# Patient Record
Sex: Male | Born: 1960
Health system: Southern US, Community
[De-identification: ages and names within clinical notes are randomized; demographics above are authoritative.]

## PROBLEM LIST (undated history)

## (undated) DIAGNOSIS — R1032 Left lower quadrant pain: Secondary | ICD-10-CM

## (undated) DIAGNOSIS — R55 Syncope and collapse: Secondary | ICD-10-CM

## (undated) DIAGNOSIS — G40909 Epilepsy, unspecified, not intractable, without status epilepticus: Secondary | ICD-10-CM

## (undated) DIAGNOSIS — R569 Unspecified convulsions: Secondary | ICD-10-CM

## (undated) HISTORY — DX: Unspecified convulsions: R56.9

## (undated) HISTORY — DX: Syncope and collapse: R55

## (undated) HISTORY — PX: HERNIA REPAIR: SHX51

## (undated) HISTORY — DX: Epilepsy, unspecified, not intractable, without status epilepticus: G40.909

## (undated) HISTORY — PX: TONSILLECTOMY: SUR1361

## (undated) HISTORY — DX: Left lower quadrant pain: R10.32

---

## 1998-12-04 HISTORY — PX: LASIK: SHX215

## 2001-12-04 HISTORY — PX: BLEPHAROPLASTY: SUR158

## 2006-11-23 ENCOUNTER — Encounter: Payer: Self-pay | Admitting: Family Medicine

## 2009-04-19 ENCOUNTER — Ambulatory Visit: Payer: Self-pay | Admitting: Family Medicine

## 2009-04-19 DIAGNOSIS — R1032 Left lower quadrant pain: Secondary | ICD-10-CM | POA: Insufficient documentation

## 2009-04-19 HISTORY — DX: Left lower quadrant pain: R10.32

## 2009-04-19 LAB — CONVERTED CEMR LAB
Bilirubin Urine: NEGATIVE
Blood in Urine, dipstick: NEGATIVE

## 2009-04-20 LAB — CONVERTED CEMR LAB
Basophils Absolute: 0 10*3/uL (ref 0.0–0.1)
Basophils Relative: 0 % (ref 0.0–3.0)
Eosinophils Absolute: 0 10*3/uL (ref 0.0–0.7)
HCT: 41.8 % (ref 39.0–52.0)
Hemoglobin: 14.5 g/dL (ref 13.0–17.0)
Lymphocytes Relative: 12.5 % (ref 12.0–46.0)
Lymphs Abs: 1.6 10*3/uL (ref 0.7–4.0)
MCHC: 34.8 g/dL (ref 30.0–36.0)
Monocytes Relative: 2.6 % — ABNORMAL LOW (ref 3.0–12.0)
Neutro Abs: 10.9 10*3/uL — ABNORMAL HIGH (ref 1.4–7.7)
RBC: 4.32 M/uL (ref 4.22–5.81)
RDW: 11.9 % (ref 11.5–14.6)

## 2009-04-26 ENCOUNTER — Encounter: Payer: Self-pay | Admitting: Family Medicine

## 2009-04-26 DIAGNOSIS — F449 Dissociative and conversion disorder, unspecified: Secondary | ICD-10-CM | POA: Insufficient documentation

## 2010-09-21 ENCOUNTER — Telehealth: Payer: Self-pay | Admitting: Family Medicine

## 2010-09-28 ENCOUNTER — Ambulatory Visit: Payer: Self-pay | Admitting: Family Medicine

## 2010-09-28 LAB — CONVERTED CEMR LAB
AST: 22 units/L (ref 0–37)
Alkaline Phosphatase: 68 units/L (ref 39–117)
Basophils Relative: 0.5 % (ref 0.0–3.0)
Bilirubin, Direct: 0.1 mg/dL (ref 0.0–0.3)
Blood in Urine, dipstick: NEGATIVE
Calcium: 8.8 mg/dL (ref 8.4–10.5)
Creatinine, Ser: 0.9 mg/dL (ref 0.4–1.5)
Eosinophils Absolute: 0.1 10*3/uL (ref 0.0–0.7)
Eosinophils Relative: 1.8 % (ref 0.0–5.0)
GFR calc non Af Amer: 91.16 mL/min (ref >60)
HDL: 38 mg/dL — ABNORMAL LOW (ref >39.00)
Hemoglobin: 14.6 g/dL (ref 13.0–17.0)
Ketones, urine, test strip: NEGATIVE
LDL Cholesterol: 115 mg/dL — ABNORMAL HIGH (ref 0–99)
Lymphocytes Relative: 35.5 % (ref 12.0–46.0)
MCHC: 35.4 g/dL (ref 30.0–36.0)
Monocytes Relative: 12.2 % — ABNORMAL HIGH (ref 3.0–12.0)
Neutro Abs: 2.2 10*3/uL (ref 1.4–7.7)
Neutrophils Relative %: 50 % (ref 43.0–77.0)
Nitrite: NEGATIVE
RBC: 4.26 M/uL (ref 4.22–5.81)
Sodium: 135 meq/L (ref 135–145)
Specific Gravity, Urine: 1.01
Total CHOL/HDL Ratio: 4
Total Protein: 6.1 g/dL (ref 6.0–8.3)
Triglycerides: 57 mg/dL (ref 0.0–149.0)
Urobilinogen, UA: 0.2
VLDL: 11.4 mg/dL (ref 0.0–40.0)
WBC Urine, dipstick: NEGATIVE
WBC: 4.3 10*3/uL — ABNORMAL LOW (ref 4.5–10.5)

## 2010-10-10 ENCOUNTER — Ambulatory Visit: Payer: Self-pay | Admitting: Family Medicine

## 2011-01-05 NOTE — Progress Notes (Signed)
Summary: REQUEST FOR ORDER  Phone Note Call from Patient   Caller: Patient's wife Neysa Bonito)  [phone] Summary of Call: Pt set up appt for cpx / labs on 10/26.... would like to have order for PSA to be added to cpx labs...Marland KitchenMarland KitchenCan you advise?   Initial call taken by: Debbra Riding,  September 21, 2010 10:52 AM  Follow-up for Phone Call        we can go ahead and order.  Only issue is whether insurance will cover priior to 50 if no FH. Follow-up by: Evelena Peat MD,  September 21, 2010 12:28 PM  Additional Follow-up for Phone Call Additional follow up Details #1::        I called and spoke with pts wife... she adv to go ahead and add lab order for PSA to the cpx labwork that pt is having done on 10/26.... Pts wife understands cost of test if ins doesn't pay (approx $58).  Additional Follow-up by: Debbra Riding,  September 21, 2010 12:50 PM

## 2011-01-05 NOTE — Assessment & Plan Note (Signed)
Summary: CPX // RS   Vital Signs:  Patient profile:   50 year old male Height:      70.50 inches Weight:      185 pounds BMI:     26.26 Temp:     98.5 degrees F oral Pulse rate:   72 / minute Pulse rhythm:   regular Resp:     12 per minute BP sitting:   118 / 80  (left arm) Cuff size:   regular  Vitals Entered By: Sid Falcon LPN (October 10, 2010 9:11 AM)  Nutrition Counseling: Patient's BMI is greater than 25 and therefore counseled on weight management options.  History of Present Illness: Here for CPE.  History of a seizure disorder which has been stable on Keppra. He continues to see neurologist once yearly.  No recent seizures.   Last tetanus 2006. Already received flu vaccine. Exercises regularly about 5-6 days per week.  Past medical history, social history, and family history reviewed  Clinical Review Panels:  Immunizations   Last Tetanus Booster:  Td (06/09/2005)  Lipid Management   Cholesterol:  164 (09/28/2010)   LDL (bad choesterol):  115 (09/28/2010)   HDL (good cholesterol):  38.00 (09/28/2010)  Diabetes Management   Creatinine:  0.9 (09/28/2010)  CBC   WBC:  4.3 (09/28/2010)   RBC:  4.26 (09/28/2010)   Hgb:  14.6 (09/28/2010)   Hct:  41.1 (09/28/2010)   Platelets:  231.0 (09/28/2010)   MCV  96.5 (09/28/2010)   MCHC  35.4 (09/28/2010)   RDW  12.3 (09/28/2010)   PMN:  50.0 (09/28/2010)   Lymphs:  35.5 (09/28/2010)   Monos:  12.2 (09/28/2010)   Eosinophils:  1.8 (09/28/2010)   Basophil:  0.5 (09/28/2010)  Complete Metabolic Panel   Glucose:  87 (09/28/2010)   Sodium:  135 (09/28/2010)   Potassium:  4.2 (09/28/2010)   Chloride:  100 (09/28/2010)   CO2:  30 (09/28/2010)   BUN:  9 (09/28/2010)   Creatinine:  0.9 (09/28/2010)   Albumin:  3.7 (09/28/2010)   Total Protein:  6.1 (09/28/2010)   Calcium:  8.8 (09/28/2010)   Total Bili:  0.7 (09/28/2010)   Alk Phos:  68 (09/28/2010)   SGPT (ALT):  17 (09/28/2010)   SGOT (AST):  22  (09/28/2010)   Allergies (verified): No Known Drug Allergies  Past History:  Past Medical History: Last updated: 04/26/2009 Chicken pox autonomic seizures with deja-vu symptoms  Past Surgical History: Last updated: 04/26/2009 Hernia 1987 Hernia 1994 Tonsillectomy  Family History: Last updated: 10/10/2010 Family History of Alcoholism/Addiction, parents Family History Hypertension  father Family History of Stroke M 1st degree relative father 20s Family History Lung cancer  grandfather Father Melanoma  Social History: Last updated: 04/26/2009 Occupation:  Sales Married Never Smoked Alcohol use-yes Regular exercise-yes  Risk Factors: Exercise: yes (04/26/2009)  Risk Factors: Smoking Status: never (04/19/2009) PMH-FH-SH reviewed for relevance  Family History: Family History of Alcoholism/Addiction, parents Family History Hypertension  father Family History of Stroke M 1st degree relative father 9s Family History Lung cancer  grandfather Father Melanoma  Review of Systems  The patient denies anorexia, fever, weight loss, weight gain, vision loss, decreased hearing, hoarseness, chest pain, syncope, dyspnea on exertion, peripheral edema, prolonged cough, headaches, hemoptysis, abdominal pain, melena, hematochezia, severe indigestion/heartburn, hematuria, incontinence, genital sores, muscle weakness, suspicious skin lesions, transient blindness, difficulty walking, depression, unusual weight change, abnormal bleeding, enlarged lymph nodes, and testicular masses.    Physical Exam  General:  Well-developed,well-nourished,in no acute distress;  alert,appropriate and cooperative throughout examination Head:  Normocephalic and atraumatic without obvious abnormalities.  Eyes:  No corneal or conjunctival inflammation noted. EOMI. Perrla. Funduscopic exam benign, without hemorrhages, exudates or papilledema. Vision grossly normal. Ears:  External ear exam shows no significant  lesions or deformities.  Otoscopic examination reveals clear canals, tympanic membranes are intact bilaterally without bulging, retraction, inflammation or discharge. Hearing is grossly normal bilaterally. Mouth:  Oral mucosa and oropharynx without lesions or exudates.  Teeth in good repair. Neck:  No deformities, masses, or tenderness noted. Lungs:  Normal respiratory effort, chest expands symmetrically. Lungs are clear to auscultation, no crackles or wheezes. Heart:  Normal rate and regular rhythm. S1 and S2 normal without gallop, murmur, click, rub or other extra sounds. Abdomen:  Bowel sounds positive,abdomen soft and non-tender without masses, organomegaly or hernias noted. Rectal:  No external abnormalities noted. Normal sphincter tone. No rectal masses or tenderness. Prostate:  Prostate gland firm and smooth, no enlargement, nodularity, tenderness, mass, asymmetry or induration. Msk:  No deformity or scoliosis noted of thoracic or lumbar spine.   Extremities:  No clubbing, cyanosis, edema, or deformity noted with normal full range of motion of all joints.   Neurologic:  alert & oriented X3 and cranial nerves II-XII intact.   Skin:  small angioma right cheek no concerning skin lesions noted Cervical Nodes:  No lymphadenopathy noted Psych:  Cognition and judgment appear intact. Alert and cooperative with normal attention span and concentration. No apparent delusions, illusions, hallucinations   Impression & Recommendations:  Problem # 1:  Preventive Health Care (ICD-V70.0) labs reviewed with patient and all favorable with the exception of lower HDL. Continue regular exercise. Tetanus up-to-date. Copy of labs to neurologist.  Complete Medication List: 1)  Keppra 250 Mg Tabs (Levetiracetam) .... Take 1 tablet by mouth at bedtime   Orders Added: 1)  Est. Patient 40-64 years [[id number]]

## 2011-05-31 ENCOUNTER — Telehealth: Payer: Self-pay | Admitting: Family Medicine

## 2011-05-31 MED ORDER — SCOPOLAMINE 1 MG/3DAYS TD PT72: 1.0000 | MEDICATED_PATCH | TRANSDERMAL | Status: AC

## 2011-05-31 NOTE — Telephone Encounter (Signed)
Pt called and is req to get a motion sickness patch, Transderm Scope. Pt is going on a fishing trip next week. Pls call in to [facility].

## 2011-05-31 NOTE — Telephone Encounter (Signed)
Please advise 

## 2011-05-31 NOTE — Telephone Encounter (Signed)
Ok to prescribe scopolamine patch one behind ear every 3 days prn motion sickness.

## 2011-05-31 NOTE — Telephone Encounter (Signed)
Pt informed Rx sent to his pharmacy on [address]

## 2013-01-29 ENCOUNTER — Telehealth: Payer: Self-pay | Admitting: Family Medicine

## 2013-01-29 NOTE — Telephone Encounter (Signed)
yes

## 2013-01-29 NOTE — Telephone Encounter (Signed)
Pt is having CPX labs done tomorrow (2/27). Pt would also like to have test for testosterone level done. Is that OK? Can you put in computer?

## 2013-01-30 ENCOUNTER — Other Ambulatory Visit (INDEPENDENT_AMBULATORY_CARE_PROVIDER_SITE_OTHER): Payer: BC Managed Care – PPO

## 2013-01-30 DIAGNOSIS — Z0000 Encounter for general adult medical examination without abnormal findings: Secondary | ICD-10-CM

## 2013-01-30 LAB — CBC WITH DIFFERENTIAL/PLATELET
Basophils Absolute: 0 10*3/uL (ref 0.0–0.1)
HCT: 41.4 % (ref 39.0–52.0)
Lymphs Abs: 1.8 10*3/uL (ref 0.7–4.0)
MCV: 93.8 fl (ref 78.0–100.0)
Monocytes Absolute: 0.4 10*3/uL (ref 0.1–1.0)
Monocytes Relative: 7.2 % (ref 3.0–12.0)
Neutrophils Relative %: 55.3 % (ref 43.0–77.0)
Platelets: 311 10*3/uL (ref 150.0–400.0)
RDW: 12.8 % (ref 11.5–14.6)

## 2013-01-30 LAB — POCT URINALYSIS DIPSTICK
Blood, UA: NEGATIVE
Ketones, UA: NEGATIVE
Leukocytes, UA: NEGATIVE
Protein, UA: NEGATIVE
pH, UA: 7

## 2013-01-30 LAB — BASIC METABOLIC PANEL
BUN: 13 mg/dL (ref 6–23)
Creatinine, Ser: 1.1 mg/dL (ref 0.4–1.5)
GFR: 79.45 mL/min (ref >60.00)
Glucose, Bld: 88 mg/dL (ref 70–99)
Potassium: 4.3 mEq/L (ref 3.5–5.1)

## 2013-01-30 LAB — LIPID PANEL
HDL: 47.5 mg/dL (ref >39.00)
LDL Cholesterol: 106 mg/dL — ABNORMAL HIGH (ref 0–99)
Total CHOL/HDL Ratio: 4
VLDL: 15.4 mg/dL (ref 0.0–40.0)

## 2013-01-30 LAB — HEPATIC FUNCTION PANEL
Bilirubin, Direct: 0.1 mg/dL (ref 0.0–0.3)
Total Bilirubin: 0.7 mg/dL (ref 0.3–1.2)

## 2013-01-30 LAB — TSH: TSH: 2.64 u[IU]/mL (ref 0.35–5.50)

## 2013-01-30 LAB — PSA: PSA: 0.56 ng/mL (ref 0.10–4.00)

## 2013-02-12 ENCOUNTER — Encounter: Payer: Self-pay | Admitting: Family Medicine

## 2013-02-12 ENCOUNTER — Ambulatory Visit (INDEPENDENT_AMBULATORY_CARE_PROVIDER_SITE_OTHER): Payer: BC Managed Care – PPO | Admitting: Family Medicine

## 2013-02-12 VITALS — BP 130/80 | HR 72 | Temp 97.7°F | Resp 12 | Ht 71.0 in | Wt 189.0 lb

## 2013-02-12 DIAGNOSIS — G40909 Epilepsy, unspecified, not intractable, without status epilepticus: Secondary | ICD-10-CM

## 2013-02-12 HISTORY — DX: Epilepsy, unspecified, not intractable, without status epilepticus: G40.909

## 2013-02-12 NOTE — Patient Instructions (Addendum)
Call me when you are ready to schedule colonoscopy. 
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
## 2013-07-09 ENCOUNTER — Telehealth: Payer: Self-pay | Admitting: Nurse Practitioner

## 2013-07-09 MED ORDER — LEVETIRACETAM 250 MG PO TABS: 250.0000 mg | ORAL_TABLET | Freq: Two times a day (BID) | ORAL | Status: DC

## 2013-07-09 NOTE — Telephone Encounter (Signed)
Rx sent 

## 2013-07-29 ENCOUNTER — Encounter: Payer: Self-pay | Admitting: Neurology

## 2013-07-30 ENCOUNTER — Ambulatory Visit (INDEPENDENT_AMBULATORY_CARE_PROVIDER_SITE_OTHER): Payer: BC Managed Care – PPO | Admitting: Neurology

## 2013-07-30 ENCOUNTER — Encounter: Payer: Self-pay | Admitting: Neurology

## 2013-07-30 VITALS — BP 128/80 | HR 68 | Resp 16 | Wt 186.0 lb

## 2013-07-30 DIAGNOSIS — R404 Transient alteration of awareness: Secondary | ICD-10-CM

## 2013-07-30 MED ORDER — LEVETIRACETAM 250 MG PO TABS: 250.0000 mg | ORAL_TABLET | Freq: Two times a day (BID) | ORAL | Status: DC

## 2013-07-30 NOTE — Patient Instructions (Addendum)
Levetiracetam tablets What is this medicine? LEVETIRACETAM (lee ve tye RA se tam) is an antiepileptic drug. It is used with other medicines to treat certain types of seizures. This medicine may be used for other purposes; ask your health care provider or pharmacist if you have questions. What should I tell my health care provider before I take this medicine? They need to know if you have any of these conditions: -kidney disease -suicidal thoughts, plans, or attempt; a previous suicide attempt by you or a family member -an unusual or allergic reaction to levetiracetam, other medicines, foods, dyes, or preservatives -pregnant or trying to get pregnant -breast-feeding How should I use this medicine? Take this medicine by mouth with a glass of water. Follow the directions on the prescription label. Do not cut, crush, or chew this medicine. You may take this medicine with or without food. Take your doses at regular intervals. Do not take your medicine more often than directed. Do not stop taking this medicine or any of your seizure medicines unless instructed by your doctor or health care professional. Stopping your medicine suddenly can increase your seizures or their severity. A special MedGuide will be given to you by the pharmacist with each prescription and refill. Be sure to read this information carefully each time. Contact your pediatrician or health care professional regarding the use of this medication in children. While this drug may be prescribed for children as young as 4 years of age for selected conditions, precautions do apply. Overdosage: If you think you have taken too much of this medicine contact a poison control center or emergency room at once. NOTE: This medicine is only for you. Do not share this medicine with others. What if I miss a dose? If you miss a dose, take it as soon as you can. If it is almost time for your next dose, take only that dose. Do not take double or extra  doses. What may interact with this medicine? -sevelamer This list may not describe all possible interactions. Give your health care provider a list of all the medicines, herbs, non-prescription drugs, or dietary supplements you use. Also tell them if you smoke, drink alcohol, or use illegal drugs. Some items may interact with your medicine. What should I watch for while using this medicine? Visit your doctor or health care professional for a regular check on your progress. Wear a medical identification bracelet or chain to say you have epilepsy, and carry a card that lists all your medications. It is important to take this medicine exactly as instructed by your health care professional. When first starting treatment, your dose may need to be adjusted. It may take weeks or months before your dose is stable. You should contact your doctor or health care professional if your seizures get worse or if you have any new types of seizures. You may get drowsy or dizzy. Do not drive, use machinery, or do anything that needs mental alertness until you know how this medicine affects you. Do not stand or sit up quickly, especially if you are an older patient. This reduces the risk of dizzy or fainting spells. Alcohol may interfere with the effect of this medicine. Avoid alcoholic drinks. The use of this medicine may increase the chance of suicidal thoughts or actions. Pay special attention to how you are responding while on this medicine. Any worsening of mood, or thoughts of suicide or dying should be reported to your health care professional right away. Women who become pregnant   while using this medicine may enroll in the [place] American Antiepileptic Drug Pregnancy Registry by calling 1-888-233-2334. This registry collects information about the safety of antiepileptic drug use during pregnancy. What side effects may I notice from receiving this medicine? Side effects you should report to your doctor or health care  professional as soon as possible: -allergic reactions like skin rash, itching or hives, swelling of the face, lips, or tongue -breathing problems -dark urine -general ill feeling or flu-like symptoms -problems with balance, talking, walking -unusually weak or tired -worsening of mood, thoughts or actions of suicide or dying -yellowing of the eyes or skin Side effects that usually do not require medical attention (report to your doctor or health care professional if they continue or are bothersome): -diarrhea -dizzy, drowsy -headache -loss of appetite This list may not describe all possible side effects. Call your doctor for medical advice about side effects. You may report side effects to FDA at 1-800-FDA-1088. Where should I keep my medicine? Keep out of reach of children. Store at room temperature between 15 and 30 degrees C (59 and 86 degrees F). Throw away any unused medicine after the expiration date. NOTE: This sheet is a summary. It may not cover all possible information. If you have questions about this medicine, talk to your doctor, pharmacist, or health care provider.  2013, Elsevier/Gold Standard. (10/04/2011 12:23:20 PM)  

## 2013-07-30 NOTE — Progress Notes (Signed)
[facility]  Provider:  Melvyn Novas, M D  Referring Provider: Kristian Covey, MD Primary Care Physician:  Kristian Covey, MD  No chief complaint on file.   HPI:  Robert Nielsen is a 52 y.o. male  Is seen here as a referral/ revisit  from Dr. Caryl Never for  spells of alteration of consciousness.   Patient was last seen in Gen. 2013. His work diagnosis was possible seizures. The patient had started on keppra 250 mg twice a day and has not had any spells since being continuously treated with this medication, although he has now to take the generic form. He had no neurologic complaints today  and denies any side effects to the medication.  Review of Systems: Out of a complete 14 system review, the patient complains of only the following symptoms, and all other reviewed systems are negative. None.  History   Social History  . Marital Status: Married    Spouse Name: Silva Bandy    Number of Children: 3  . Years of Education: Assoc.   Occupational History  . Materials engineer   Social History Main Topics  . Smoking status: Never Smoker   . Smokeless tobacco: Never Used  . Alcohol Use: Yes     Comment: 1-2 drinks weekly  . Drug Use: No  . Sexual Activity: Not on file   Other Topics Concern  . Not on file   Social History Narrative   Patient is ambidextrous, consumes four  Cups of caffeinated beverages daily.    Family History  Problem Relation Age of Onset  . Cancer Father 35    melanoma  . Hypertension Father     Past Medical History  Diagnosis Date  . Abdominal pain, left lower quadrant 04/19/2009    Qualifier: Diagnosis of  By: Caryl Never MD, Bruce    . Seizure disorder 02/12/2013  . Syncope and collapse   . Seizures     Past Surgical History  Procedure Laterality Date  . Hernia repair  1987, 1994  . Tonsillectomy      Current Outpatient Prescriptions  Medication Sig Dispense Refill  . levETIRAcetam (KEPPRA)  250 MG tablet Take 1 tablet (250 mg total) by mouth 2 (two) times daily.  180 tablet  0   No current facility-administered medications for this visit.    Allergies as of 07/30/2013  . (No Known Allergies)    Vitals: BP 128/80  Pulse 68  Resp 16  Wt 186 lb (84.369 kg)  BMI 25.58 kg/m2 Last Weight:  Wt Readings from Last 1 Encounters:  07/30/13 186 lb (84.369 kg)   Last Height:   Ht Readings from Last 1 Encounters:  07/29/13 5' 11.5" (1.816 m)    Physical exam:  General: The patient is awake, alert and appears not in acute distress. The patient is well groomed. Head: Normocephalic, atraumatic. Neck is supple. Cardiovascular:  Regular rate and rhythm, without  murmurs or carotid bruit, and without distended neck veins. Respiratory: Lungs are clear to auscultation. Skin:  Without evidence of edema, or rash Trunk:patient  has normal posture.  Neurologic exam : The patient is awake and alert, oriented to place and time.  Memory subjective  described as intact. There is a normal attention span & concentration ability. Speech is fluent without  dysarthria, dysphonia or aphasia. Mood and affect are appropriate.  Cranial nerves: Pupils are equal and briskly reactive to light. Funduscopic exam without evidence of pallor  or edema. Extraocular movements  in vertical and horizontal planes intact and without nystagmus. Visual fields by finger perimetry are intact. Hearing to finger rub intact.  Facial sensation intact to fine touch. Facial motor strength is symmetric and tongue and uvula move midline.  Motor exam:   Normal tone and normal muscle bulk and symmetric normal strength in all extremities.  Sensory:  Fine touch, pinprick and vibration were tested in all extremities. Proprioception is tested  normal.  Coordination: Rapid alternating movements in the fingers/hands is tested and normal. Finger-to-nose maneuver tested and normal without evidence of ataxia, dysmetria or  tremor.  Gait and station: Patient walks without assistive device and is able and assisted stool climb up to the exam table. Strength within normal limits. Stance is stable and normal. Tandem gait isunfragmented.  Deep tendon reflexes: in the  upper and lower extremities are symmetric and intact. Babinski maneuver response is downgoing.   Assessment:  After physical and neurologic examination, review of laboratory studies, imaging, neurophysiology testing and pre-existing records, assessment is that of idiopathic, cryptogenic seizures. The patient had no spells since being on Or. We'll refill his medication today.  His primary care physician, Dr. Caryl Never, has regularly followed with lap pads to that I do not need to repeat these today. I will also not order further memory testing in the future.  Plan: refill , every 12 month RV with NP.

## 2015-01-11 ENCOUNTER — Other Ambulatory Visit (INDEPENDENT_AMBULATORY_CARE_PROVIDER_SITE_OTHER): Payer: 59

## 2015-01-11 DIAGNOSIS — Z0000 Encounter for general adult medical examination without abnormal findings: Secondary | ICD-10-CM

## 2015-01-11 LAB — BASIC METABOLIC PANEL
BUN: 13 mg/dL (ref 6–23)
CHLORIDE: 102 meq/L (ref 96–112)
CO2: 28 meq/L (ref 19–32)
Calcium: 9 mg/dL (ref 8.4–10.5)
Creatinine, Ser: 0.94 mg/dL (ref 0.40–1.50)
GFR: 89.58 mL/min (ref >60.00)
GLUCOSE: 86 mg/dL (ref 70–99)
POTASSIUM: 4.2 meq/L (ref 3.5–5.1)
Sodium: 136 mEq/L (ref 135–145)

## 2015-01-11 LAB — CBC WITH DIFFERENTIAL/PLATELET
BASOS ABS: 0 10*3/uL (ref 0.0–0.1)
BASOS PCT: 0.4 % (ref 0.0–3.0)
EOS ABS: 0.1 10*3/uL (ref 0.0–0.7)
Eosinophils Relative: 1.1 % (ref 0.0–5.0)
HEMATOCRIT: 41.7 % (ref 39.0–52.0)
HEMOGLOBIN: 14.3 g/dL (ref 13.0–17.0)
LYMPHS ABS: 1.7 10*3/uL (ref 0.7–4.0)
LYMPHS PCT: 23.6 % (ref 12.0–46.0)
MCHC: 34.3 g/dL (ref 30.0–36.0)
MCV: 95.9 fl (ref 78.0–100.0)
MONO ABS: 0.5 10*3/uL (ref 0.1–1.0)
Monocytes Relative: 6.6 % (ref 3.0–12.0)
NEUTROS ABS: 4.8 10*3/uL (ref 1.4–7.7)
Neutrophils Relative %: 68.3 % (ref 43.0–77.0)
Platelets: 262 10*3/uL (ref 150.0–400.0)
RBC: 4.35 Mil/uL (ref 4.22–5.81)
RDW: 12.8 % (ref 11.5–15.5)
WBC: 7 10*3/uL (ref 4.0–10.5)

## 2015-01-11 LAB — HEPATIC FUNCTION PANEL
ALBUMIN: 3.9 g/dL (ref 3.5–5.2)
ALT: 9 U/L (ref 0–53)
AST: 15 U/L (ref 0–37)
Alkaline Phosphatase: 65 U/L (ref 39–117)
BILIRUBIN DIRECT: 0.1 mg/dL (ref 0.0–0.3)
BILIRUBIN TOTAL: 0.5 mg/dL (ref 0.2–1.2)
TOTAL PROTEIN: 6 g/dL (ref 6.0–8.3)

## 2015-01-11 LAB — LIPID PANEL
CHOLESTEROL: 143 mg/dL (ref 0–200)
HDL: 48.6 mg/dL (ref >39.00)
LDL CALC: 81 mg/dL (ref 0–99)
NonHDL: 94.4
Total CHOL/HDL Ratio: 3
Triglycerides: 65 mg/dL (ref 0.0–149.0)
VLDL: 13 mg/dL (ref 0.0–40.0)

## 2015-01-11 LAB — PSA: PSA: 0.48 ng/mL (ref 0.10–4.00)

## 2015-01-11 LAB — TSH: TSH: 3.19 u[IU]/mL (ref 0.35–4.50)

## 2015-01-28 ENCOUNTER — Ambulatory Visit (INDEPENDENT_AMBULATORY_CARE_PROVIDER_SITE_OTHER): Payer: 59 | Admitting: Family Medicine

## 2015-01-28 ENCOUNTER — Encounter: Payer: Self-pay | Admitting: Family Medicine

## 2015-01-28 VITALS — BP 130/80 | HR 79 | Temp 98.0°F | Ht 71.26 in | Wt 172.0 lb

## 2015-01-28 DIAGNOSIS — M898X9 Other specified disorders of bone, unspecified site: Secondary | ICD-10-CM

## 2015-01-28 DIAGNOSIS — Z0000 Encounter for general adult medical examination without abnormal findings: Secondary | ICD-10-CM

## 2015-01-28 DIAGNOSIS — M899 Disorder of bone, unspecified: Secondary | ICD-10-CM

## 2015-01-28 DIAGNOSIS — Z23 Encounter for immunization: Secondary | ICD-10-CM

## 2015-01-28 DIAGNOSIS — R404 Transient alteration of awareness: Secondary | ICD-10-CM

## 2015-01-28 MED ORDER — LEVETIRACETAM 250 MG PO TABS: 250.0000 mg | ORAL_TABLET | Freq: Two times a day (BID) | ORAL | Status: DC

## 2015-01-28 NOTE — Progress Notes (Signed)
Subjective:    Patient ID: Robert Nielsen, male    DOB: [birth year], 54 y.o.   MRN: [id number]  HPI Patient seen for medical follow-up. 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 He's been followed by neurology and recently released by them. Has been maintained for several years on low-dose Keppra which seems to work. Otherwise very healthy. Exercises about 5 days a week. His tetanus is due this year. He has not had colonoscopy and is not ready to go this point yet.  He has had some prominence of the past 6 right months of his right distal clavicle but never associated pain. No history of injury. He has had full range of motion right shoulder.  Past Medical History  Diagnosis Date  . Abdominal pain, left lower quadrant 04/19/2009    Qualifier: Diagnosis of  By: Elease Hashimoto MD, Bruce    . Seizure disorder 02/12/2013  . Syncope and collapse   . Seizures    Past Surgical History  Procedure Laterality Date  . Hernia repair  1987, 1994  . Tonsillectomy      reports that he has never smoked. He has never used smokeless tobacco. He reports that he drinks alcohol. He reports that he does not use illicit drugs. family history includes Cancer (age of onset: 29) in his father; Hypertension in his father. No Known Allergies    Review of Systems  Constitutional: Negative for fever, activity change, appetite change and fatigue.  HENT: Negative for congestion, ear pain and trouble swallowing.   Eyes: Negative for pain and visual disturbance.  Respiratory: Negative for cough, shortness of breath and wheezing.   Cardiovascular: Negative for chest pain and palpitations.  Gastrointestinal: Negative for nausea, vomiting, abdominal pain, diarrhea, constipation, blood in stool, abdominal distention and rectal pain.  Genitourinary: Negative for dysuria, hematuria and testicular pain.  Musculoskeletal: Negative for joint swelling and arthralgias.  Skin: Negative  for rash.  Neurological: Negative for dizziness, syncope and headaches.  Hematological: Negative for adenopathy.  Psychiatric/Behavioral: Negative for confusion and dysphoric mood.       Objective:   Physical Exam  Constitutional: He is oriented to person, place, and time. He appears well-developed and well-nourished. No distress.  HENT:  Head: Normocephalic and atraumatic.  Right Ear: External ear normal.  Left Ear: External ear normal.  Mouth/Throat: Oropharynx is clear and moist.  Eyes: Conjunctivae and EOM are normal. Pupils are equal, round, and reactive to light.  Neck: Normal range of motion. Neck supple. No thyromegaly present.  Cardiovascular: Normal rate, regular rhythm and normal heart sounds.   No murmur heard. Pulmonary/Chest: No respiratory distress. He has no wheezes. He has no rales.  Abdominal: Soft. Bowel sounds are normal. He exhibits no distension and no mass. There is no tenderness. There is no rebound and no guarding.  Genitourinary: Rectum normal and prostate normal.  Musculoskeletal: He exhibits no edema.  Full range of motion right shoulder. Right distal clavicle reveals bony prominence which is nontender. This is visibly more prominent than the left side. No AC joint tenderness.  Lymphadenopathy:    He has no cervical adenopathy.  Neurological: He is alert and oriented to person, place, and time. He displays normal reflexes. No cranial nerve deficit.  Skin: No rash noted.  Psychiatric: He has a normal mood and affect.          Assessment & Plan:  Complete physical. Labs reviewed. We've recommended colonoscopy but this point he is  not willing to go yet. He'll let us know if he changes his mind. Tetanus booster given. Obtain plain films right clavicle to assess bony prominence. He does not have any worrisome symptoms such as pain

## 2015-01-28 NOTE — Progress Notes (Signed)
Pre visit review using our clinic review tool, if applicable. No additional management support is needed unless otherwise documented below in the visit note. 

## 2015-01-28 NOTE — Addendum Note (Signed)
Addended by: Marcina Millard on: 01/28/2015 10:16 AM   Modules accepted: Orders

## 2015-02-17 ENCOUNTER — Ambulatory Visit (INDEPENDENT_AMBULATORY_CARE_PROVIDER_SITE_OTHER)
Admission: RE | Admit: 2015-02-17 | Discharge: 2015-02-17 | Disposition: A | Discharge status: Home / Self Care | Payer: 59 | Source: Ambulatory Visit | Attending: Family Medicine | Admitting: Family Medicine

## 2015-02-17 DIAGNOSIS — M899 Disorder of bone, unspecified: Secondary | ICD-10-CM

## 2015-02-17 DIAGNOSIS — M898X9 Other specified disorders of bone, unspecified site: Secondary | ICD-10-CM

## 2016-03-09 ENCOUNTER — Other Ambulatory Visit (INDEPENDENT_AMBULATORY_CARE_PROVIDER_SITE_OTHER): Payer: 59

## 2016-03-09 DIAGNOSIS — Z0000 Encounter for general adult medical examination without abnormal findings: Secondary | ICD-10-CM | POA: Diagnosis not present

## 2016-03-09 LAB — BASIC METABOLIC PANEL
BUN: 15 mg/dL (ref 6–23)
CHLORIDE: 103 meq/L (ref 96–112)
CO2: 30 meq/L (ref 19–32)
CREATININE: 1.06 mg/dL (ref 0.40–1.50)
Calcium: 9.6 mg/dL (ref 8.4–10.5)
GFR: 77.63 mL/min (ref >60.00)
GLUCOSE: 88 mg/dL (ref 70–99)
Potassium: 4.5 mEq/L (ref 3.5–5.1)
Sodium: 139 mEq/L (ref 135–145)

## 2016-03-09 LAB — CBC WITH DIFFERENTIAL/PLATELET
BASOS PCT: 0.5 % (ref 0.0–3.0)
Basophils Absolute: 0 10*3/uL (ref 0.0–0.1)
EOS ABS: 0 10*3/uL (ref 0.0–0.7)
Eosinophils Relative: 0.8 % (ref 0.0–5.0)
HEMATOCRIT: 41.3 % (ref 39.0–52.0)
Hemoglobin: 14.4 g/dL (ref 13.0–17.0)
LYMPHS PCT: 42.6 % (ref 12.0–46.0)
Lymphs Abs: 1.9 10*3/uL (ref 0.7–4.0)
MCHC: 34.8 g/dL (ref 30.0–36.0)
MCV: 95.2 fl (ref 78.0–100.0)
MONOS PCT: 8.6 % (ref 3.0–12.0)
Monocytes Absolute: 0.4 10*3/uL (ref 0.1–1.0)
NEUTROS ABS: 2.1 10*3/uL (ref 1.4–7.7)
Neutrophils Relative %: 47.5 % (ref 43.0–77.0)
PLATELETS: 269 10*3/uL (ref 150.0–400.0)
RBC: 4.34 Mil/uL (ref 4.22–5.81)
RDW: 13.1 % (ref 11.5–15.5)
WBC: 4.4 10*3/uL (ref 4.0–10.5)

## 2016-03-09 LAB — PSA: PSA: 0.56 ng/mL (ref 0.10–4.00)

## 2016-03-09 LAB — HEPATIC FUNCTION PANEL
ALBUMIN: 4.2 g/dL (ref 3.5–5.2)
ALT: 13 U/L (ref 0–53)
AST: 17 U/L (ref 0–37)
Alkaline Phosphatase: 53 U/L (ref 39–117)
Bilirubin, Direct: 0.1 mg/dL (ref 0.0–0.3)
TOTAL PROTEIN: 6.4 g/dL (ref 6.0–8.3)
Total Bilirubin: 0.9 mg/dL (ref 0.2–1.2)

## 2016-03-09 LAB — LIPID PANEL
CHOLESTEROL: 173 mg/dL (ref 0–200)
HDL: 56.3 mg/dL (ref >39.00)
LDL Cholesterol: 100 mg/dL — ABNORMAL HIGH (ref 0–99)
NonHDL: 116.64
Total CHOL/HDL Ratio: 3
Triglycerides: 81 mg/dL (ref 0.0–149.0)
VLDL: 16.2 mg/dL (ref 0.0–40.0)

## 2016-03-09 LAB — TSH: TSH: 2.75 u[IU]/mL (ref 0.35–4.50)

## 2016-03-15 ENCOUNTER — Encounter: Payer: Self-pay | Admitting: Family Medicine

## 2016-03-15 ENCOUNTER — Ambulatory Visit (INDEPENDENT_AMBULATORY_CARE_PROVIDER_SITE_OTHER): Payer: 59 | Admitting: Family Medicine

## 2016-03-15 VITALS — BP 90/70 | HR 90 | Temp 98.8°F | Ht 70.25 in | Wt 173.3 lb

## 2016-03-15 DIAGNOSIS — Z0000 Encounter for general adult medical examination without abnormal findings: Secondary | ICD-10-CM

## 2016-03-15 NOTE — Progress Notes (Signed)
Pre visit review using our clinic review tool, if applicable. No additional management support is needed unless otherwise documented below in the visit note. 

## 2016-03-15 NOTE — Progress Notes (Signed)
   Subjective:    Patient ID: Robert Nielsen, male    DOB: Apr 23, 1961, 55 y.o.   MRN: [id number]  HPI  Patient seen for physical exam Generally very healthy. Exercises most days of the week. Nonsmoker. He has history of transient alteration of awareness. Presumed seizure variant. For several years followed by neurology and treated with Keppra. Currently stable with no recent events.  Needs screening colonoscopy. Tetanus up-to-date. Declines shingles vaccine.  Past Medical History  Diagnosis Date  . Abdominal pain, left lower quadrant 04/19/2009    Qualifier: Diagnosis of  By: Elease Hashimoto MD, Saylah Ketner    . Seizure disorder ([place]) 02/12/2013  . Syncope and collapse   . Seizures [facility])    Past Surgical History  Procedure Laterality Date  . Hernia repair  1987, 1994  . Tonsillectomy      reports that he has never smoked. He has never used smokeless tobacco. He reports that he drinks alcohol. He reports that he does not use illicit drugs. family history includes Cancer (age of onset: 48) in his father; Hypertension in his father. No Known Allergies   Review of Systems  Constitutional: Negative for fever, activity change, appetite change and fatigue.  HENT: Negative for congestion, ear pain and trouble swallowing.   Eyes: Negative for pain and visual disturbance.  Respiratory: Negative for cough, chest tightness, shortness of breath and wheezing.   Cardiovascular: Negative for chest pain, palpitations and leg swelling.  Gastrointestinal: Negative for nausea, vomiting, abdominal pain, diarrhea, constipation, blood in stool, abdominal distention and rectal pain.  Endocrine: Negative for polydipsia and polyuria.  Genitourinary: Negative for dysuria, hematuria and testicular pain.  Musculoskeletal: Negative for joint swelling and arthralgias.  Skin: Negative for rash.  Neurological: Negative for dizziness, syncope, weakness, light-headedness and headaches.  Hematological: Negative for  adenopathy.  Psychiatric/Behavioral: Negative for confusion and dysphoric mood.       Objective:   Physical Exam  Constitutional: He is oriented to person, place, and time. He appears well-developed and well-nourished. No distress.  HENT:  Head: Normocephalic and atraumatic.  Right Ear: External ear normal.  Left Ear: External ear normal.  Mouth/Throat: Oropharynx is clear and moist.  Eyes: Conjunctivae and EOM are normal. Pupils are equal, round, and reactive to light.  Neck: Normal range of motion. Neck supple. No thyromegaly present.  Cardiovascular: Normal rate, regular rhythm and normal heart sounds.   No murmur heard. Pulmonary/Chest: No respiratory distress. He has no wheezes. He has no rales.  Abdominal: Soft. Bowel sounds are normal. He exhibits no distension and no mass. There is no tenderness. There is no rebound and no guarding.  Musculoskeletal: He exhibits no edema.  Lymphadenopathy:    He has no cervical adenopathy.  Neurological: He is alert and oriented to person, place, and time. He displays normal reflexes. No cranial nerve deficit.  Skin: No rash noted.  Psychiatric: He has a normal mood and affect.          Assessment & Plan:  Physical exam. Schedule screening colonoscopy. Continue regular healthy exercise habits. He also has excellent diet. Labs reviewed. No major concerns.

## 2016-03-15 NOTE — Patient Instructions (Signed)
We will set up your colonoscopy.

## 2016-03-17 ENCOUNTER — Other Ambulatory Visit: Payer: Self-pay | Admitting: Family Medicine

## 2016-04-03 ENCOUNTER — Encounter: Payer: Self-pay | Admitting: Internal Medicine

## 2016-05-09 ENCOUNTER — Encounter: Payer: Self-pay | Admitting: Internal Medicine

## 2016-05-09 ENCOUNTER — Ambulatory Visit (AMBULATORY_SURGERY_CENTER): Payer: Self-pay

## 2016-05-09 VITALS — Ht 70.0 in | Wt 172.2 lb

## 2016-05-09 DIAGNOSIS — Z1211 Encounter for screening for malignant neoplasm of colon: Secondary | ICD-10-CM

## 2016-05-09 MED ORDER — NA SULFATE-K SULFATE-MG SULF 17.5-3.13-1.6 GM/177ML PO SOLN: ORAL | Status: DC

## 2016-05-09 NOTE — Progress Notes (Signed)
Per pt, no allergies to soy or egg products.Pt not taking any weight loss meds or using  O2 at home. 

## 2016-05-17 ENCOUNTER — Encounter: Payer: Self-pay | Admitting: Internal Medicine

## 2016-05-17 ENCOUNTER — Ambulatory Visit (AMBULATORY_SURGERY_CENTER): Payer: 59 | Admitting: Internal Medicine

## 2016-05-17 VITALS — BP 114/60 | HR 72 | Temp 96.8°F | Resp 16 | Ht 70.0 in | Wt 173.0 lb

## 2016-05-17 DIAGNOSIS — D120 Benign neoplasm of cecum: Secondary | ICD-10-CM

## 2016-05-17 DIAGNOSIS — Z1211 Encounter for screening for malignant neoplasm of colon: Secondary | ICD-10-CM

## 2016-05-17 MED ORDER — SODIUM CHLORIDE 0.9 % IV SOLN: 500.0000 mL | INTRAVENOUS | Status: DC

## 2016-05-17 NOTE — Progress Notes (Signed)
  [facility] Post-op Note  Patient: Robert Nielsen  Procedure(s) Performed: colonoscopy  Patient Location: [facility] - Recovery Area  Anesthesia Type: Deep Sedation/Propofol  Level of Consciousness: awake, oriented and patient cooperative  Airway and Oxygen Therapy: Patient Spontanous Breathing  Post-op Pain: none  Post-op Assessment:  Post-op Vital signs reviewed, Patient's Cardiovascular Status Stable, Respiratory Function Stable, Patent Airway, No signs of Nausea or vomiting and Pain level controlled  Post-op Vital Signs: Reviewed and stable  Complications: No apparent anesthesia complications  Maureen Duesing E 8:58 AM

## 2016-05-17 NOTE — Patient Instructions (Signed)
YOU HAD AN ENDOSCOPIC PROCEDURE TODAY AT THE [facility]:   Refer to the procedure report that was given to you for any specific questions about what was found during the examination.  If the procedure report does not answer your questions, please call your gastroenterologist to clarify.  If you requested that your care partner not be given the details of your procedure findings, then the procedure report has been included in a sealed envelope for you to review at your convenience later.  YOU SHOULD EXPECT: Some feelings of bloating in the abdomen. Passage of more gas than usual.  Walking can help get rid of the air that was put into your GI tract during the procedure and reduce the bloating. If you had a lower endoscopy (such as a colonoscopy or flexible sigmoidoscopy) you may notice spotting of blood in your stool or on the toilet paper. If you underwent a bowel prep for your procedure, you may not have a normal bowel movement for a few days.  Please Note:  You might notice some irritation and congestion in your nose or some drainage.  This is from the oxygen used during your procedure.  There is no need for concern and it should clear up in a day or so.  SYMPTOMS TO REPORT IMMEDIATELY:   Following lower endoscopy (colonoscopy or flexible sigmoidoscopy):  Excessive amounts of blood in the stool  Significant tenderness or worsening of abdominal pains  Swelling of the abdomen that is new, acute  Fever of 100F or higher   For urgent or emergent issues, a gastroenterologist can be reached at any hour by calling [phone].   DIET: Your first meal following the procedure should be a small meal and then it is ok to progress to your normal diet. Heavy or fried foods are harder to digest and may make you feel nauseous or bloated.  Likewise, meals heavy in dairy and vegetables can increase bloating.  Drink plenty of fluids but you should avoid alcoholic beverages for 24  hours.  ACTIVITY:  You should plan to take it easy for the rest of today and you should NOT DRIVE or use heavy machinery until tomorrow (because of the sedation medicines used during the test).    FOLLOW UP: Our staff will call the number listed on your records the next business day following your procedure to check on you and address any questions or concerns that you may have regarding the information given to you following your procedure. If we do not reach you, we will leave a message.  However, if you are feeling well and you are not experiencing any problems, there is no need to return our call.  We will assume that you have returned to your regular daily activities without incident.  If any biopsies were taken you will be contacted by phone or by letter within the next 1-3 weeks.  Please call us at [phone] if you have not heard about the biopsies in 3 weeks.    SIGNATURES/CONFIDENTIALITY: You and/or your care partner have signed paperwork which will be entered into your electronic medical record.  These signatures attest to the fact that that the information above on your After Visit Summary has been reviewed and is understood.  Full responsibility of the confidentiality of this discharge information lies with you and/or your care-partner. 

## 2016-05-17 NOTE — Progress Notes (Signed)
Called to room to assist during endoscopic procedure.  Patient ID and intended procedure confirmed with present staff. Received instructions for my participation in the procedure from the performing physician.  

## 2016-05-17 NOTE — Op Note (Signed)
[facility] Patient Name: Robert Nielsen Procedure Date: 05/17/2016 7:44 AM MRN: [id number] Endoscopist: Jerene Bears , MD Age: 55 Referring MD:  Date of Birth: [birth year] Gender: Male Procedure:                Colonoscopy Indications:              Screening for colorectal malignant neoplasm, This                            is the patient's first colonoscopy Medicines:                Monitored Anesthesia Care Procedure:                Pre-Anesthesia Assessment:                           - Prior to the procedure, a History and Physical                            was performed, and patient medications and                            allergies were reviewed. The patient's tolerance of                            previous anesthesia was also reviewed. The risks                            and benefits of the procedure and the sedation                            options and risks were discussed with the patient.                            All questions were answered, and informed consent                            was obtained. Prior Anticoagulants: The patient has                            taken no previous anticoagulant or antiplatelet                            agents. ASA Grade Assessment: II - A patient with                            mild systemic disease. After reviewing the risks                            and benefits, the patient was deemed in                            satisfactory condition to undergo the procedure.  After obtaining informed consent, the colonoscope                            was passed under direct vision. Throughout the                            procedure, the patient's blood pressure, pulse, and                            oxygen saturations were monitored continuously. The                            Model CF-HQ190L [id number]) scope was introduced                            through the anus and advanced to the the cecum,                            identified by appendiceal orifice and ileocecal                            valve. The colonoscopy was performed without                            difficulty. The patient tolerated the procedure                            well. The quality of the bowel preparation was                            good. The ileocecal valve, appendiceal orifice, and                            rectum were photographed. Scope In: 8:39:06 AM Scope Out: 8:52:57 AM Scope Withdrawal Time: 0 hours 9 minutes 40 seconds  Total Procedure Duration: 0 hours 13 minutes 51 seconds  Findings:                 The digital rectal exam was normal.                           A 5 mm polyp was found in the cecum. The polyp was                            sessile. The polyp was removed with a cold snare.                            Resection and retrieval were complete.                           Multiple small and large-mouthed diverticula were                            found in the sigmoid colon, descending colon,  splenic flexure and hepatic flexure.                           The exam was otherwise without abnormality on                            direct and retroflexion views. Complications:            No immediate complications. Estimated Blood Loss:     Estimated blood loss: none. Impression:               - One 5 mm polyp in the cecum, removed with a cold                            snare. Resected and retrieved.                           - Moderate diverticulosis in the sigmoid colon, in                            the descending colon, at the splenic flexure and at                            the hepatic flexure.                           - The examination was otherwise normal on direct                            and retroflexion views. Recommendation:           - Patient has a contact number available for                            emergencies. The signs and symptoms of  potential                            delayed complications were discussed with the                            patient. Return to normal activities tomorrow.                            Written discharge instructions were provided to the                            patient.                           - Resume previous diet.                           - Continue present medications.                           - Await pathology results.                           -  Repeat colonoscopy is recommended. The                            colonoscopy date will be determined after pathology                            results from today's exam become available for                            review. Jerene Bears, MD 05/17/2016 8:56:57 AM This report has been signed electronically.

## 2016-05-18 ENCOUNTER — Telehealth: Payer: Self-pay | Admitting: *Deleted

## 2016-05-18 NOTE — Telephone Encounter (Signed)
  Follow up Call-  Call back number 05/17/2016  Post procedure Call Back phone  # [phone]  Permission to leave phone message Yes     Patient questions:  Do you have a fever, pain , or abdominal swelling? No. Pain Score  0 *  Have you tolerated food without any problems? Yes.    Have you been able to return to your normal activities? Yes.    Do you have any questions about your discharge instructions: Diet   No. Medications  No. Follow up visit  No.  Do you have questions or concerns about your Care? No.  Actions: * If pain score is 4 or above: No action needed, pain <4.

## 2016-05-23 ENCOUNTER — Encounter: Payer: Self-pay | Admitting: Internal Medicine

## 2017-02-19 ENCOUNTER — Other Ambulatory Visit: Payer: Self-pay | Admitting: Family Medicine

## 2017-05-11 ENCOUNTER — Other Ambulatory Visit: Payer: Self-pay | Admitting: Family Medicine

## 2017-05-14 ENCOUNTER — Other Ambulatory Visit: Payer: Self-pay | Admitting: *Deleted

## 2017-05-14 MED ORDER — LEVETIRACETAM 250 MG PO TABS: 250.0000 mg | ORAL_TABLET | Freq: Two times a day (BID) | ORAL | 0 refills | Status: DC

## 2017-05-14 NOTE — Telephone Encounter (Signed)
Rx done. 

## 2017-10-23 ENCOUNTER — Other Ambulatory Visit: Payer: Self-pay | Admitting: Orthopedic Surgery

## 2017-10-23 DIAGNOSIS — M25561 Pain in right knee: Secondary | ICD-10-CM

## 2017-10-24 ENCOUNTER — Ambulatory Visit
Admission: RE | Admit: 2017-10-24 | Discharge: 2017-10-24 | Disposition: A | Discharge status: Home / Self Care | Payer: 59 | Source: Ambulatory Visit | Attending: Orthopedic Surgery | Admitting: Orthopedic Surgery

## 2017-10-24 DIAGNOSIS — M25561 Pain in right knee: Secondary | ICD-10-CM

## 2017-10-31 ENCOUNTER — Other Ambulatory Visit: Payer: 59

## 2017-11-03 HISTORY — PX: MENISCUS REPAIR: SHX5179

## 2018-11-13 ENCOUNTER — Ambulatory Visit (INDEPENDENT_AMBULATORY_CARE_PROVIDER_SITE_OTHER): Payer: BLUE CROSS/BLUE SHIELD | Admitting: Family Medicine

## 2018-11-13 ENCOUNTER — Encounter: Payer: Self-pay | Admitting: Family Medicine

## 2018-11-13 ENCOUNTER — Other Ambulatory Visit: Payer: Self-pay

## 2018-11-13 VITALS — BP 110/70 | HR 65 | Temp 98.0°F | Ht 68.0 in | Wt 172.8 lb

## 2018-11-13 DIAGNOSIS — Z0000 Encounter for general adult medical examination without abnormal findings: Secondary | ICD-10-CM

## 2018-11-13 LAB — CBC WITH DIFFERENTIAL/PLATELET
Basophils Absolute: 0 10*3/uL (ref 0.0–0.1)
Basophils Relative: 0.9 % (ref 0.0–3.0)
EOS PCT: 1.1 % (ref 0.0–5.0)
Eosinophils Absolute: 0 10*3/uL (ref 0.0–0.7)
HCT: 44.4 % (ref 39.0–52.0)
Hemoglobin: 15.5 g/dL (ref 13.0–17.0)
LYMPHS ABS: 1.5 10*3/uL (ref 0.7–4.0)
LYMPHS PCT: 33 % (ref 12.0–46.0)
MCHC: 34.9 g/dL (ref 30.0–36.0)
MCV: 94.5 fl (ref 78.0–100.0)
MONO ABS: 0.4 10*3/uL (ref 0.1–1.0)
Monocytes Relative: 8.1 % (ref 3.0–12.0)
NEUTROS ABS: 2.5 10*3/uL (ref 1.4–7.7)
Neutrophils Relative %: 56.9 % (ref 43.0–77.0)
Platelets: 296 10*3/uL (ref 150.0–400.0)
RBC: 4.7 Mil/uL (ref 4.22–5.81)
RDW: 13.2 % (ref 11.5–15.5)
WBC: 4.4 10*3/uL (ref 4.0–10.5)

## 2018-11-13 LAB — LIPID PANEL
CHOLESTEROL: 178 mg/dL (ref 0–200)
HDL: 54 mg/dL (ref >39.00)
LDL Cholesterol: 107 mg/dL — ABNORMAL HIGH (ref 0–99)
NonHDL: 123.61
Total CHOL/HDL Ratio: 3
Triglycerides: 84 mg/dL (ref 0.0–149.0)
VLDL: 16.8 mg/dL (ref 0.0–40.0)

## 2018-11-13 LAB — BASIC METABOLIC PANEL
BUN: 13 mg/dL (ref 6–23)
CALCIUM: 9.9 mg/dL (ref 8.4–10.5)
CO2: 31 mEq/L (ref 19–32)
Chloride: 102 mEq/L (ref 96–112)
Creatinine, Ser: 1 mg/dL (ref 0.40–1.50)
GFR: 82.21 mL/min (ref >60.00)
Glucose, Bld: 95 mg/dL (ref 70–99)
Potassium: 5.2 mEq/L — ABNORMAL HIGH (ref 3.5–5.1)
SODIUM: 138 meq/L (ref 135–145)

## 2018-11-13 LAB — HEPATIC FUNCTION PANEL
ALK PHOS: 54 U/L (ref 39–117)
ALT: 12 U/L (ref 0–53)
AST: 19 U/L (ref 0–37)
Albumin: 4.4 g/dL (ref 3.5–5.2)
BILIRUBIN DIRECT: 0.1 mg/dL (ref 0.0–0.3)
Total Bilirubin: 0.6 mg/dL (ref 0.2–1.2)
Total Protein: 6.6 g/dL (ref 6.0–8.3)

## 2018-11-13 LAB — PSA: PSA: 0.61 ng/mL (ref 0.10–4.00)

## 2018-11-13 LAB — TSH: TSH: 2.66 u[IU]/mL (ref 0.35–4.50)

## 2018-11-13 MED ORDER — LEVETIRACETAM 250 MG PO TABS: 250.0000 mg | ORAL_TABLET | Freq: Two times a day (BID) | ORAL | 3 refills | Status: DC

## 2018-11-13 NOTE — Progress Notes (Signed)
Subjective:     Patient ID: Robert Nielsen, male   DOB: 05-15-1961, 57 y.o.   MRN: [id number]  HPI Patient seen for physical exam.  Generally very healthy.  He exercises most days of the week.  He has had previous episodes of transient alteration of awareness and had seen neurologist and started on Keppra and this has controlled his symptoms completely.  He declines flu vaccine.  No history of hepatitis C screening but low risk.  No history of shingles vaccine.  Colonoscopy up-to-date.  Family history reviewed.  His father died of 35 melanoma complications.  Patient gets regular dermatology checks yearly  His only complaint is that he is had some issues with curvature of the penis consistent with Peyronnies disease.  Not functionally limiting at this time.  He is not ready to see urologist yet  Past Medical History:  Diagnosis Date  . Abdominal pain, left lower quadrant 04/19/2009   Qualifier: Diagnosis of  By: Elease Hashimoto MD, Terese Heier    . Seizure ([place])    last seizure over  10 years ago  . Seizure disorder ([place]) 02/12/2013  . Syncope and collapse    Past Surgical History:  Procedure Laterality Date  . BLEPHAROPLASTY  2003   Bil  . [place]   right side 2 times  . LASIK  2000  . MENISCUS REPAIR  11/2017   Right knee  . TONSILLECTOMY      reports that he has never smoked. He has never used smokeless tobacco. He reports that he drinks about 7.0 standard drinks of alcohol per week. He reports that he does not use drugs. family history includes Cancer (age of onset: 27) in his father; Hypertension in his father. No Known Allergies   Review of Systems  Constitutional: Negative for activity change, appetite change, fatigue and fever.  HENT: Negative for congestion, ear pain and trouble swallowing.   Eyes: Negative for pain and visual disturbance.  Respiratory: Negative for cough, shortness of breath and wheezing.   Cardiovascular: Negative for chest pain and  palpitations.  Gastrointestinal: Negative for abdominal distention, abdominal pain, blood in stool, constipation, diarrhea, nausea, rectal pain and vomiting.  Genitourinary: Negative for dysuria, hematuria and testicular pain.  Musculoskeletal: Negative for arthralgias and joint swelling.  Skin: Negative for rash.  Neurological: Negative for dizziness, syncope and headaches.  Hematological: Negative for adenopathy.  Psychiatric/Behavioral: Negative for confusion and dysphoric mood.       Objective:   Physical Exam  Constitutional: He is oriented to person, place, and time. He appears well-developed and well-nourished. No distress.  HENT:  Head: Normocephalic and atraumatic.  Right Ear: External ear normal.  Left Ear: External ear normal.  Mouth/Throat: Oropharynx is clear and moist.  Eyes: Pupils are equal, round, and reactive to light. Conjunctivae and EOM are normal.  Neck: Normal range of motion. Neck supple. No thyromegaly present.  Cardiovascular: Normal rate, regular rhythm and normal heart sounds.  No murmur heard. Pulmonary/Chest: No respiratory distress. He has no wheezes. He has no rales.  Abdominal: Soft. Bowel sounds are normal. He exhibits no distension and no mass. There is no tenderness. There is no rebound and no guarding.  Musculoskeletal: He exhibits no edema.  Lymphadenopathy:    He has no cervical adenopathy.  Neurological: He is alert and oriented to person, place, and time. He displays normal reflexes. No cranial nerve deficit.  Skin: No rash noted.  Psychiatric: He has a normal mood and affect.  Assessment:     Physical exam.  Patient is a healthy 57 year old gentleman with history of transient alteration of awareness controlled with Keppra.  He is describing symptoms of Perrone's disease but not functionally limited at this time    Plan:     -obtain lab work.  Include hepatitis C antibody -Flu vaccine recommended but patient declines -We  discussed Shingrix vaccine and he will check with insurance coverage  Eulas Post MD [facility]

## 2018-11-13 NOTE — Patient Instructions (Signed)
Consider Shingrix vaccine- and let us know if interested   

## 2018-11-14 LAB — HEPATITIS C ANTIBODY
Hepatitis C Ab: NONREACTIVE
SIGNAL TO CUT-OFF: 0.03 (ref <1.00)

## 2019-11-18 ENCOUNTER — Other Ambulatory Visit: Payer: Self-pay | Admitting: Family Medicine

## 2019-12-14 ENCOUNTER — Other Ambulatory Visit: Payer: Self-pay | Admitting: Family Medicine

## 2019-12-15 NOTE — Telephone Encounter (Signed)
Called patient and let him know that he needs to schedule a physical or a virtual for refills. Patient stated that he has medication and is in the gym and will call and schedule an appointment when he can.

## 2019-12-26 ENCOUNTER — Encounter: Payer: BLUE CROSS/BLUE SHIELD | Admitting: Family Medicine

## 2019-12-30 ENCOUNTER — Other Ambulatory Visit (INDEPENDENT_AMBULATORY_CARE_PROVIDER_SITE_OTHER): Payer: BC Managed Care – PPO

## 2019-12-30 ENCOUNTER — Other Ambulatory Visit: Payer: Self-pay

## 2019-12-30 ENCOUNTER — Ambulatory Visit (INDEPENDENT_AMBULATORY_CARE_PROVIDER_SITE_OTHER): Payer: BC Managed Care – PPO | Admitting: Family Medicine

## 2019-12-30 ENCOUNTER — Encounter: Payer: Self-pay | Admitting: Family Medicine

## 2019-12-30 VITALS — BP 118/80 | HR 71 | Temp 98.0°F | Ht 70.0 in | Wt 168.2 lb

## 2019-12-30 DIAGNOSIS — Z0000 Encounter for general adult medical examination without abnormal findings: Secondary | ICD-10-CM

## 2019-12-30 LAB — HEPATIC FUNCTION PANEL
ALT: 15 U/L (ref 0–53)
AST: 20 U/L (ref 0–37)
Albumin: 4.4 g/dL (ref 3.5–5.2)
Alkaline Phosphatase: 53 U/L (ref 39–117)
Bilirubin, Direct: 0.1 mg/dL (ref 0.0–0.3)
Total Bilirubin: 0.8 mg/dL (ref 0.2–1.2)
Total Protein: 6.6 g/dL (ref 6.0–8.3)

## 2019-12-30 LAB — CBC WITH DIFFERENTIAL/PLATELET
Basophils Absolute: 0 10*3/uL (ref 0.0–0.1)
Basophils Relative: 1 % (ref 0.0–3.0)
Eosinophils Absolute: 0 10*3/uL (ref 0.0–0.7)
Eosinophils Relative: 1 % (ref 0.0–5.0)
HCT: 43.5 % (ref 39.0–52.0)
Hemoglobin: 15.2 g/dL (ref 13.0–17.0)
Lymphocytes Relative: 39.9 % (ref 12.0–46.0)
Lymphs Abs: 1.4 10*3/uL (ref 0.7–4.0)
MCHC: 34.9 g/dL (ref 30.0–36.0)
MCV: 97.8 fl (ref 78.0–100.0)
Monocytes Absolute: 0.4 10*3/uL (ref 0.1–1.0)
Monocytes Relative: 11.1 % (ref 3.0–12.0)
Neutro Abs: 1.7 10*3/uL (ref 1.4–7.7)
Neutrophils Relative %: 47 % (ref 43.0–77.0)
Platelets: 270 10*3/uL (ref 150.0–400.0)
RBC: 4.45 Mil/uL (ref 4.22–5.81)
RDW: 13.1 % (ref 11.5–15.5)
WBC: 3.6 10*3/uL — ABNORMAL LOW (ref 4.0–10.5)

## 2019-12-30 LAB — BASIC METABOLIC PANEL
BUN: 15 mg/dL (ref 6–23)
CO2: 29 mEq/L (ref 19–32)
Calcium: 9.7 mg/dL (ref 8.4–10.5)
Chloride: 100 mEq/L (ref 96–112)
Creatinine, Ser: 0.95 mg/dL (ref 0.40–1.50)
GFR: 81.73 mL/min (ref >60.00)
Glucose, Bld: 101 mg/dL — ABNORMAL HIGH (ref 70–99)
Potassium: 4.5 mEq/L (ref 3.5–5.1)
Sodium: 135 mEq/L (ref 135–145)

## 2019-12-30 LAB — TSH: TSH: 3.02 u[IU]/mL (ref 0.35–4.50)

## 2019-12-30 LAB — LIPID PANEL
Cholesterol: 205 mg/dL — ABNORMAL HIGH (ref 0–200)
HDL: 68.9 mg/dL (ref >39.00)
LDL Cholesterol: 119 mg/dL — ABNORMAL HIGH (ref 0–99)
NonHDL: 135.61
Total CHOL/HDL Ratio: 3
Triglycerides: 85 mg/dL (ref 0.0–149.0)
VLDL: 17 mg/dL (ref 0.0–40.0)

## 2019-12-30 LAB — PSA: PSA: 0.75 ng/mL (ref 0.10–4.00)

## 2019-12-30 MED ORDER — LEVETIRACETAM 250 MG PO TABS: ORAL_TABLET | ORAL | 3 refills | Status: DC

## 2019-12-30 NOTE — Progress Notes (Signed)
Subjective:     Patient ID: MD. HOOS, male   DOB: 1961-11-26, 59 y.o.   MRN: [id number]  HPI Robert Nielsen is here for physical exam.  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  He remains on regimen of Keppra 250 mg twice daily and seizures are very well controlled.  He needs refills.  He stays very active and exercises most days of the week.  Appetite and weight are stable.  He declines flu vaccines.  Tetanus is up-to-date.  Colonoscopy up-to-date.  No history of shingles vaccine.   Past Medical History:  Diagnosis Date  . Abdominal pain, left lower quadrant 04/19/2009   Qualifier: Diagnosis of  By: Elease Hashimoto MD, Sowmya Partridge    . Seizure ([place])    last seizure over  10 years ago  . Seizure disorder ([place]) 02/12/2013  . Syncope and collapse    Past Surgical History:  Procedure Laterality Date  . BLEPHAROPLASTY  2003   Bil  . [place]   right side 2 times  . LASIK  2000  . MENISCUS REPAIR  11/2017   Right knee  . TONSILLECTOMY      reports that he has never smoked. He has never used smokeless tobacco. He reports current alcohol use of about 7.0 standard drinks of alcohol per week. He reports that he does not use drugs. family history includes Cancer (age of onset: 35) in his father; Hypertension in his father. No Known Allergies ' Review of Systems  Constitutional: Negative for activity change, appetite change, fever and unexpected weight change.  HENT: Negative for trouble swallowing.   Respiratory: Negative for cough and shortness of breath.   Cardiovascular: Negative for chest pain and leg swelling.  Gastrointestinal: Negative for abdominal pain and vomiting.  Genitourinary: Negative for dysuria, flank pain and hematuria.  Musculoskeletal: Negative for back pain and joint swelling.  Neurological: Negative for weakness, numbness and headaches.       Objective:   Physical Exam Vitals reviewed.  Constitutional:      General: He  is not in acute distress.    Appearance: Normal appearance. He is well-developed.  HENT:     Head: Normocephalic and atraumatic.     Right Ear: External ear normal.     Left Ear: External ear normal.  Eyes:     Conjunctiva/sclera: Conjunctivae normal.     Pupils: Pupils are equal, round, and reactive to light.  Neck:     Thyroid: No thyromegaly.  Cardiovascular:     Rate and Rhythm: Normal rate and regular rhythm.     Heart sounds: Normal heart sounds. No murmur.  Pulmonary:     Effort: No respiratory distress.     Breath sounds: No wheezing or rales.  Abdominal:     General: Bowel sounds are normal. There is no distension.     Palpations: Abdomen is soft. There is no mass.     Tenderness: There is no abdominal tenderness. There is no guarding or rebound.  Musculoskeletal:     Cervical back: Normal range of motion and neck supple.  Lymphadenopathy:     Cervical: No cervical adenopathy.  Skin:    Findings: No rash.  Neurological:     Mental Status: He is alert and oriented to person, place, and time.     Cranial Nerves: No cranial nerve deficit.     Deep Tendon Reflexes: Reflexes normal.  Psychiatric:        Mood  and Affect: Mood normal.        Thought Content: Thought content normal.        Assessment:     Physical exam.  He has history of dj vu seizures which have been controlled with Keppra.  We discussed the following health maintenance issues    Plan:     -Flu vaccine offered but declined -We discussed Shingrix vaccine and he will check on insurance coverage -Obtain follow-up screening labs -Refilled Keppra for 1 year  Darnell Level Dan Europe MD [facility]

## 2019-12-30 NOTE — Patient Instructions (Signed)
Preventive Care 41-57 Years Old, Male Preventive care refers to lifestyle choices and visits with your health care provider that can promote health and wellness. This includes:  A yearly physical exam. This is also called an annual well check.  Regular dental and eye exams.  Immunizations.  Screening for certain conditions.  Healthy lifestyle choices, such as eating a healthy diet, getting regular exercise, not using drugs or products that contain nicotine and tobacco, and limiting alcohol use. What can I expect for my preventive care visit? Physical exam Your health care provider will check:  Height and weight. These may be used to calculate body mass index (BMI), which is a measurement that tells if you are at a healthy weight.  Heart rate and blood pressure.  Your skin for abnormal spots. Counseling Your health care provider may ask you questions about:  Alcohol, tobacco, and drug use.  Emotional well-being.  Home and relationship well-being.  Sexual activity.  Eating habits.  Work and work Statistician. What immunizations do I need?  Influenza (flu) vaccine  This is recommended every year. Tetanus, diphtheria, and pertussis (Tdap) vaccine  You may need a Td booster every 10 years. Varicella (chickenpox) vaccine  You may need this vaccine if you have not already been vaccinated. Zoster (shingles) vaccine  You may need this after age 64. Measles, mumps, and rubella (MMR) vaccine  You may need at least one dose of MMR if you were born in 1957 or later. You may also need a second dose. Pneumococcal conjugate (PCV13) vaccine  You may need this if you have certain conditions and were not previously vaccinated. Pneumococcal polysaccharide (PPSV23) vaccine  You may need one or two doses if you smoke cigarettes or if you have certain conditions. Meningococcal conjugate (MenACWY) vaccine  You may need this if you have certain conditions. Hepatitis A  vaccine  You may need this if you have certain conditions or if you travel or work in places where you may be exposed to hepatitis A. Hepatitis B vaccine  You may need this if you have certain conditions or if you travel or work in places where you may be exposed to hepatitis B. Haemophilus influenzae type b (Hib) vaccine  You may need this if you have certain risk factors. Human papillomavirus (HPV) vaccine  If recommended by your health care provider, you may need three doses over 6 months. You may receive vaccines as individual doses or as more than one vaccine together in one shot (combination vaccines). Talk with your health care provider about the risks and benefits of combination vaccines. What tests do I need? Blood tests  Lipid and cholesterol levels. These may be checked every 5 years, or more frequently if you are over 60 years old.  Hepatitis C test.  Hepatitis B test. Screening  Lung cancer screening. You may have this screening every year starting at age 43 if you have a 30-pack-year history of smoking and currently smoke or have quit within the past 15 years.  Prostate cancer screening. Recommendations will vary depending on your family history and other risks.  Colorectal cancer screening. All adults should have this screening starting at age 72 and continuing until age 2. Your health care provider may recommend screening at age 14 if you are at increased risk. You will have tests every 1-10 years, depending on your results and the type of screening test.  Diabetes screening. This is done by checking your blood sugar (glucose) after you have not eaten  for a while (fasting). You may have this done every 1-3 years.  Sexually transmitted disease (STD) testing. Follow these instructions at home: Eating and drinking  Eat a diet that includes fresh fruits and vegetables, whole grains, lean protein, and low-fat dairy products.  Take vitamin and mineral supplements as  recommended by your health care provider.  Do not drink alcohol if your health care provider tells you not to drink.  If you drink alcohol: ? Limit how much you have to 0-2 drinks a day. ? Be aware of how much alcohol is in your drink. In the [place] one drink equals one 12 oz bottle of beer (355 mL), one 5 oz glass of wine (148 mL), or one 1 oz glass of hard liquor (44 mL). Lifestyle  Take daily care of your teeth and gums.  Stay active. Exercise for at least 30 minutes on 5 or more days each week.  Do not use any products that contain nicotine or tobacco, such as cigarettes, e-cigarettes, and chewing tobacco. If you need help quitting, ask your health care provider.  If you are sexually active, practice safe sex. Use a condom or other form of protection to prevent STIs (sexually transmitted infections).  Talk with your health care provider about taking a low-dose aspirin every day starting at age 46. What's next?  Go to your health care provider once a year for a well check visit.  Ask your health care provider how often you should have your eyes and teeth checked.  Stay up to date on all vaccines. This information is not intended to replace advice given to you by your health care provider. Make sure you discuss any questions you have with your health care provider. Document Revised: 11/14/2018 Document Reviewed: 11/14/2018 Elsevier Patient Education  [place].  Consider shingles vaccine (Shingrix) and check on insurance if interested.

## 2020-01-13 DIAGNOSIS — Z03818 Encounter for observation for suspected exposure to other biological agents ruled out: Secondary | ICD-10-CM | POA: Diagnosis not present

## 2020-01-13 DIAGNOSIS — Z20828 Contact with and (suspected) exposure to other viral communicable diseases: Secondary | ICD-10-CM | POA: Diagnosis not present

## 2020-01-13 DIAGNOSIS — U071 COVID-19: Secondary | ICD-10-CM | POA: Diagnosis not present

## 2020-01-30 ENCOUNTER — Emergency Department (INDEPENDENT_AMBULATORY_CARE_PROVIDER_SITE_OTHER): Payer: BC Managed Care – PPO

## 2020-01-30 ENCOUNTER — Other Ambulatory Visit: Payer: Self-pay

## 2020-01-30 ENCOUNTER — Emergency Department
Admission: EM | Admit: 2020-01-30 | Discharge: 2020-01-30 | Disposition: A | Discharge status: Home / Self Care | Payer: BC Managed Care – PPO | Source: Home / Self Care | Attending: Family Medicine | Admitting: Family Medicine

## 2020-01-30 DIAGNOSIS — S2241XA Multiple fractures of ribs, right side, initial encounter for closed fracture: Secondary | ICD-10-CM | POA: Diagnosis not present

## 2020-01-30 DIAGNOSIS — R0781 Pleurodynia: Secondary | ICD-10-CM

## 2020-01-30 DIAGNOSIS — W2211XA Striking against or struck by driver side automobile airbag, initial encounter: Secondary | ICD-10-CM | POA: Diagnosis not present
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
## 2020-01-30 NOTE — Discharge Instructions (Signed)
Apply ice pack for 20 to 30 minutes, 3 to 4 times daily  Continue until pain and swelling decrease.  May take Tylenol as needed for pain. Use a device called incentive spirometer to practice deep breathing several times a day. May use a rib belt sparingly, but discontinue if cough or cold-like symptoms develop.  If symptoms become significantly worse during the night or over the weekend, proceed to the local emergency room.
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
## 2020-08-16 ENCOUNTER — Telehealth: Payer: Self-pay | Admitting: Family Medicine

## 2020-08-16 DIAGNOSIS — R404 Transient alteration of awareness: Secondary | ICD-10-CM

## 2020-08-16 NOTE — Telephone Encounter (Signed)
Patient is needing a referral back to [facility] to Dr. Asencion Partridge Dohmeier.  He was released from Dr. Brett Fairy because he was doing better but he thinks that he needs to go back to be seen again to follow up on his issue.  Please advise

## 2020-08-16 NOTE — Addendum Note (Signed)
Addended by: Nathanial Millman E on: 08/16/2020 02:20 PM   Modules accepted: Orders

## 2020-08-16 NOTE — Telephone Encounter (Signed)
Referral placed.

## 2020-08-16 NOTE — Telephone Encounter (Signed)
Ok to refer back to Dr Brett Fairy.

## 2020-08-16 NOTE — Telephone Encounter (Signed)
Okay to place referral

## 2020-10-12 ENCOUNTER — Encounter: Payer: Self-pay | Admitting: Neurology

## 2020-10-12 ENCOUNTER — Ambulatory Visit (INDEPENDENT_AMBULATORY_CARE_PROVIDER_SITE_OTHER): Payer: BC Managed Care – PPO | Admitting: Neurology

## 2020-10-12 VITALS — BP 135/85 | HR 88 | Ht 70.0 in | Wt 172.0 lb

## 2020-10-12 DIAGNOSIS — Z7409 Other reduced mobility: Secondary | ICD-10-CM

## 2020-10-12 DIAGNOSIS — G40909 Epilepsy, unspecified, not intractable, without status epilepticus: Secondary | ICD-10-CM

## 2020-10-12 DIAGNOSIS — R438 Other disturbances of smell and taste: Secondary | ICD-10-CM | POA: Insufficient documentation

## 2020-10-12 DIAGNOSIS — U099 Post covid-19 condition, unspecified: Secondary | ICD-10-CM

## 2020-10-12 DIAGNOSIS — H9313 Tinnitus, bilateral: Secondary | ICD-10-CM | POA: Insufficient documentation

## 2020-10-12 DIAGNOSIS — R439 Unspecified disturbances of smell and taste: Secondary | ICD-10-CM

## 2020-10-12 MED ORDER — LEVETIRACETAM 250 MG PO TABS: ORAL_TABLET | ORAL | 3 refills | Status: DC

## 2020-10-12 NOTE — Patient Instructions (Signed)
Levetiracetam tablets What is this medicine? LEVETIRACETAM (lee ve tye RA se tam) is an antiepileptic drug. It is used with other medicines to treat certain types of seizures. This medicine may be used for other purposes; ask your health care provider or pharmacist if you have questions. COMMON BRAND NAME(S): Keppra, Roweepra What should I tell my health care provider before I take this medicine? They need to know if you have any of these conditions:  kidney disease  suicidal thoughts, plans, or attempt; a previous suicide attempt by you or a family member  an unusual or allergic reaction to levetiracetam, other medicines, foods, dyes, or preservatives  pregnant or trying to get pregnant  breast-feeding How should I use this medicine? Take this medicine by mouth with a glass of water. Follow the directions on the prescription label. Swallow the tablets whole. Do not crush or chew this medicine. You may take this medicine with or without food. Take your doses at regular intervals. Do not take your medicine more often than directed. Do not stop taking this medicine or any of your seizure medicines unless instructed by your doctor or health care professional. Stopping your medicine suddenly can increase your seizures or their severity. A special MedGuide will be given to you by the pharmacist with each prescription and refill. Be sure to read this information carefully each time. Contact your pediatrician or health care professional regarding the use of this medication in children. While this drug may be prescribed for children as young as 25 years of age for selected conditions, precautions do apply. Overdosage: If you think you have taken too much of this medicine contact a poison control center or emergency room at once. NOTE: This medicine is only for you. Do not share this medicine with others. What if I miss a dose? If you miss a dose, take it as soon as you can. If it is almost time for  your next dose, take only that dose. Do not take double or extra doses. What may interact with this medicine? This medicine may interact with the following medications:  carbamazepine  colesevelam  probenecid  sevelamer This list may not describe all possible interactions. Give your health care provider a list of all the medicines, herbs, non-prescription drugs, or dietary supplements you use. Also tell them if you smoke, drink alcohol, or use illegal drugs. Some items may interact with your medicine. What should I watch for while using this medicine? Visit your doctor or health care provider for a regular check on your progress. Wear a medical identification bracelet or chain to say you have epilepsy, and carry a card that lists all your medications. This medicine may cause serious skin reactions. They can happen weeks to months after starting the medicine. Contact your health care provider right away if you notice fevers or flu-like symptoms with a rash. The rash may be red or purple and then turn into blisters or peeling of the skin. Or, you might notice a red rash with swelling of the face, lips or lymph nodes in your neck or under your arms. It is important to take this medicine exactly as instructed by your health care provider. When first starting treatment, your dose may need to be adjusted. It may take weeks or months before your dose is stable. You should contact your doctor or health care provider if your seizures get worse or if you have any new types of seizures. You may get drowsy or dizzy. Do not drive,  use machinery, or do anything that needs mental alertness until you know how this medicine affects you. Do not stand or sit up quickly, especially if you are an older patient. This reduces the risk of dizzy or fainting spells. Alcohol may interfere with the effect of this medicine. Avoid alcoholic drinks. The use of this medicine may increase the chance of suicidal thoughts or actions.  Pay special attention to how you are responding while on this medicine. Any worsening of mood, or thoughts of suicide or dying should be reported to your health care provider right away. Women who become pregnant while using this medicine may enroll in the [place] Pregnancy Registry by calling [id number]. This registry collects information about the safety of antiepileptic drug use during pregnancy. What side effects may I notice from receiving this medicine? Side effects that you should report to your doctor or health care professional as soon as possible:  allergic reactions like skin rash, itching or hives, swelling of the face, lips, or tongue  breathing problems  dark urine  general ill feeling or flu-like symptoms  problems with balance, talking, walking  rash, fever, and swollen lymph nodes  redness, blistering, peeling or loosening of the skin, including inside the mouth  unusually weak or tired  worsening of mood, thoughts or actions of suicide or dying  yellowing of the eyes or skin Side effects that usually do not require medical attention (report to your doctor or health care professional if they continue or are bothersome):  diarrhea  dizzy, drowsy  headache  loss of appetite This list may not describe all possible side effects. Call your doctor for medical advice about side effects. You may report side effects to FDA at 1-800-FDA-1088. Where should I keep my medicine? Keep out of reach of children. Store at room temperature between 15 and 30 degrees C (59 and 86 degrees F). Throw away any unused medicine after the expiration date. NOTE: This sheet is a summary. It may not cover all possible information. If you have questions about this medicine, talk to your doctor, pharmacist, or health care provider.  2020 Elsevier/Gold Standard (2019-02-21 15:23:36)
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
## 2021-06-30 ENCOUNTER — Other Ambulatory Visit: Payer: Self-pay

## 2021-07-01 ENCOUNTER — Ambulatory Visit (INDEPENDENT_AMBULATORY_CARE_PROVIDER_SITE_OTHER): Payer: BC Managed Care – PPO | Admitting: Family Medicine

## 2021-07-01 ENCOUNTER — Encounter: Payer: Self-pay | Admitting: Family Medicine

## 2021-07-01 VITALS — BP 128/80 | HR 73 | Temp 98.2°F | Ht 70.0 in | Wt 174.1 lb

## 2021-07-01 DIAGNOSIS — Z0000 Encounter for general adult medical examination without abnormal findings: Secondary | ICD-10-CM

## 2021-07-01 LAB — HEPATIC FUNCTION PANEL
ALT: 13 U/L (ref 0–53)
AST: 18 U/L (ref 0–37)
Albumin: 4.2 g/dL (ref 3.5–5.2)
Alkaline Phosphatase: 57 U/L (ref 39–117)
Bilirubin, Direct: 0.1 mg/dL (ref 0.0–0.3)
Total Bilirubin: 0.8 mg/dL (ref 0.2–1.2)
Total Protein: 6.3 g/dL (ref 6.0–8.3)

## 2021-07-01 LAB — CBC WITH DIFFERENTIAL/PLATELET
Basophils Absolute: 0 10*3/uL (ref 0.0–0.1)
Basophils Relative: 0.8 % (ref 0.0–3.0)
Eosinophils Absolute: 0.1 10*3/uL (ref 0.0–0.7)
Eosinophils Relative: 1.4 % (ref 0.0–5.0)
HCT: 43 % (ref 39.0–52.0)
Hemoglobin: 14.6 g/dL (ref 13.0–17.0)
Lymphocytes Relative: 37.3 % (ref 12.0–46.0)
Lymphs Abs: 1.7 10*3/uL (ref 0.7–4.0)
MCHC: 34 g/dL (ref 30.0–36.0)
MCV: 97.6 fl (ref 78.0–100.0)
Monocytes Absolute: 0.4 10*3/uL (ref 0.1–1.0)
Monocytes Relative: 9.4 % (ref 3.0–12.0)
Neutro Abs: 2.3 10*3/uL (ref 1.4–7.7)
Neutrophils Relative %: 51.1 % (ref 43.0–77.0)
Platelets: 248 10*3/uL (ref 150.0–400.0)
RBC: 4.4 Mil/uL (ref 4.22–5.81)
RDW: 13.2 % (ref 11.5–15.5)
WBC: 4.4 10*3/uL (ref 4.0–10.5)

## 2021-07-01 LAB — LIPID PANEL
Cholesterol: 195 mg/dL (ref 0–200)
HDL: 64.1 mg/dL (ref >39.00)
LDL Cholesterol: 117 mg/dL — ABNORMAL HIGH (ref 0–99)
NonHDL: 130.54
Total CHOL/HDL Ratio: 3
Triglycerides: 66 mg/dL (ref 0.0–149.0)
VLDL: 13.2 mg/dL (ref 0.0–40.0)

## 2021-07-01 LAB — PSA: PSA: 0.57 ng/mL (ref 0.10–4.00)

## 2021-07-01 LAB — BASIC METABOLIC PANEL
BUN: 13 mg/dL (ref 6–23)
CO2: 29 mEq/L (ref 19–32)
Calcium: 9.4 mg/dL (ref 8.4–10.5)
Chloride: 103 mEq/L (ref 96–112)
Creatinine, Ser: 0.98 mg/dL (ref 0.40–1.50)
GFR: 85.03 mL/min (ref >60.00)
Glucose, Bld: 84 mg/dL (ref 70–99)
Potassium: 4.9 mEq/L (ref 3.5–5.1)
Sodium: 139 mEq/L (ref 135–145)

## 2021-07-01 LAB — TSH: TSH: 3.28 u[IU]/mL (ref 0.35–5.50)

## 2021-07-01 NOTE — Progress Notes (Signed)
Established Patient Office Visit  Subjective:  Patient ID: Robert Nielsen, male    DOB: 12-Feb-1961  Age: 60 y.o. MRN: [id number]  CC:  Chief Complaint  Patient presents with   Annual Exam    HPI Robert Nielsen presents for physical exam.  He has history of seizure disorder followed by neurology.  He remains on Keppra.  Otherwise generally very healthy.  Takes no medications other than Keppra.  He exercises regularly.  Non-smoker.  Health maintenance reviewed  -He had colonoscopy 5 years ago with adenomatous polyp with recommended 5-year follow-up.  He is not sure he wishes to pursue that at this time. -Tetanus vaccine up-to-date -He declines Shingrix vaccine -No indications for pneumonia vaccine yet  Family history-Father died in his early 17s of melanoma complications.  His mother died age 51 alcoholic cirrhosis.  He had sister that died around age 5 complications of hydrocephalus.  She was born with that.  He had a brother that died of reported primary liver cancer.  He was felt to be possibly occupation related.  Social history-he is married and has couple children.  He works for [employer].  Never smoked.  No regular alcohol.  Exercises regularly.  Past Medical History:  Diagnosis Date   Abdominal pain, left lower quadrant 04/19/2009   Qualifier: Diagnosis of  By: Elease Hashimoto MD, Bradley Bostelman     Seizure [facility])    last seizure over  10 years ago   Seizure disorder ([place]) 02/12/2013   Syncope and collapse     Past Surgical History:  Procedure Laterality Date   BLEPHAROPLASTY  2003   [place]   right side 2 times   LASIK  2000   MENISCUS REPAIR  11/2017   Right knee   TONSILLECTOMY      Family History  Problem Relation Age of Onset   Alcohol abuse Mother    Cancer Father 72       melanoma   Hypertension Father    Cancer Brother        liver cancer- ?primary    Social History   Socioeconomic History   Marital status:  Married    Spouse name: Steffanie Dunn   Number of children: 3   Years of education: Assoc.   Highest education level: Not on file  Occupational History   Occupation: Equities trader    Comment: [employer]  Tobacco Use   Smoking status: Never   Smokeless tobacco: Never  Vaping Use   Vaping Use: Never used  Substance and Sexual Activity   Alcohol use: Yes    Alcohol/week: 7.0 standard drinks    Types: 7 Standard drinks or equivalent per week    Comment: 1-2 drinks weekly   Drug use: No   Sexual activity: Not on file  Other Topics Concern   Not on file  Social History Narrative   Patient is ambidextrous, consumes four  Cups of caffeinated beverages daily.   Social Determinants of Health   Financial Resource Strain: Not on file  Food Insecurity: Not on file  Transportation Needs: Not on file  Physical Activity: Not on file  Stress: Not on file  Social Connections: Not on file  Intimate Partner Violence: Not on file    Outpatient Medications Prior to Visit  Medication Sig Dispense Refill   Ascorbic Acid Buffered (BUFFERED VITAMIN C PO) Take 4,000 mg by mouth daily.     levETIRAcetam (KEPPRA) 250 MG tablet  TAKE 1 TABLET BY MOUTH In AM and 2 in PM. 270 tablet 3   No facility-administered medications prior to visit.    No Known Allergies  ROS Review of Systems  Constitutional:  Negative for activity change, appetite change, fatigue and fever.  HENT:  Negative for congestion, ear pain and trouble swallowing.   Eyes:  Negative for pain and visual disturbance.  Respiratory:  Negative for cough, shortness of breath and wheezing.   Cardiovascular:  Negative for chest pain and palpitations.  Gastrointestinal:  Negative for abdominal distention, abdominal pain, blood in stool, constipation, diarrhea, nausea, rectal pain and vomiting.  Genitourinary:  Negative for dysuria, hematuria and testicular pain.  Musculoskeletal:  Negative for arthralgias and joint swelling.  Skin:   Negative for rash.  Neurological:  Negative for dizziness, syncope and headaches.  Hematological:  Negative for adenopathy.  Psychiatric/Behavioral:  Negative for confusion and dysphoric mood.      Objective:    Physical Exam Constitutional:      General: He is not in acute distress.    Appearance: He is well-developed.  HENT:     Head: Normocephalic and atraumatic.     Right Ear: External ear normal.     Left Ear: External ear normal.  Eyes:     Conjunctiva/sclera: Conjunctivae normal.     Pupils: Pupils are equal, round, and reactive to light.  Neck:     Thyroid: No thyromegaly.  Cardiovascular:     Rate and Rhythm: Normal rate and regular rhythm.     Heart sounds: Normal heart sounds. No murmur heard. Pulmonary:     Effort: No respiratory distress.     Breath sounds: No wheezing or rales.  Abdominal:     General: Bowel sounds are normal. There is no distension.     Palpations: Abdomen is soft. There is no mass.     Tenderness: There is no abdominal tenderness. There is no guarding or rebound.  Musculoskeletal:     Cervical back: Normal range of motion and neck supple.  Lymphadenopathy:     Cervical: No cervical adenopathy.  Skin:    Findings: No rash.  Neurological:     Mental Status: He is alert and oriented to person, place, and time.     Cranial Nerves: No cranial nerve deficit.     Deep Tendon Reflexes: Reflexes normal.    BP 128/80   Pulse 73   Temp 98.2 F (36.8 C) (Oral)   Ht '5\' 10"'$  (1.778 m)   Wt 174 lb 2 oz (79 kg)   SpO2 98%   BMI 24.98 kg/m  Wt Readings from Last 3 Encounters:  07/01/21 174 lb 2 oz (79 kg)  10/12/20 172 lb (78 kg)  01/30/20 168 lb 12.8 oz (76.6 kg)     Health Maintenance Due  Topic Date Due   COVID-19 Vaccine (1) Never done   HIV Screening  Never done   COLONOSCOPY (Pts 45-68yr Insurance coverage will need to be confirmed)  05/17/2021    There are no preventive care reminders to display for this patient.  Lab Results   Component Value Date   TSH 3.02 12/30/2019   Lab Results  Component Value Date   WBC 3.6 (L) 12/30/2019   HGB 15.2 12/30/2019   HCT 43.5 12/30/2019   MCV 97.8 12/30/2019   PLT 270.0 12/30/2019   Lab Results  Component Value Date   NA 135 12/30/2019   K 4.5 12/30/2019   CO2 29 12/30/2019  GLUCOSE 101 (H) 12/30/2019   BUN 15 12/30/2019   CREATININE 0.95 12/30/2019   BILITOT 0.8 12/30/2019   ALKPHOS 53 12/30/2019   AST 20 12/30/2019   ALT 15 12/30/2019   PROT 6.6 12/30/2019   ALBUMIN 4.4 12/30/2019   CALCIUM 9.7 12/30/2019   GFR 81.73 12/30/2019   Lab Results  Component Value Date   CHOL 205 (H) 12/30/2019   Lab Results  Component Value Date   HDL 68.90 12/30/2019   Lab Results  Component Value Date   LDLCALC 119 (H) 12/30/2019   Lab Results  Component Value Date   TRIG 85.0 12/30/2019   Lab Results  Component Value Date   CHOLHDL 3 12/30/2019   No results found for: HGBA1C    Assessment & Plan:   Problem List Items Addressed This Visit   None Visit Diagnoses     Physical exam    -  Primary   Relevant Orders   Basic metabolic panel   Lipid panel   CBC with Differential/Platelet   TSH   Hepatic function panel   PSA     Generally healthy 60 year old male.  He does have history of 1 adenomatous polyp previous colonoscopy.  He is due for repeat at this time but declines but will reconsider.  He has seizure disorder which is stable on Keppra.  -Obtain screening labs as above -Discussed Shingrix vaccine and he declines at this time -Have encouraged him to get repeat colonoscopy and he will let us know if interested  No orders of the defined types were placed in this encounter.   Follow-up: No follow-ups on file.    Carolann Littler, MD

## 2021-07-01 NOTE — Patient Instructions (Signed)
Consider repeat colonoscopy and let me know if you want me to refer.

## 2021-10-12 ENCOUNTER — Ambulatory Visit (INDEPENDENT_AMBULATORY_CARE_PROVIDER_SITE_OTHER): Payer: BC Managed Care – PPO | Admitting: Adult Health

## 2021-10-12 ENCOUNTER — Encounter: Payer: Self-pay | Admitting: Adult Health

## 2021-10-12 VITALS — BP 147/69 | HR 71 | Ht 70.0 in | Wt 176.0 lb

## 2021-10-12 DIAGNOSIS — G40909 Epilepsy, unspecified, not intractable, without status epilepticus: Secondary | ICD-10-CM | POA: Diagnosis not present

## 2021-10-12 MED ORDER — LEVETIRACETAM 250 MG PO TABS: ORAL_TABLET | ORAL | 3 refills | Status: DC

## 2021-10-12 NOTE — Patient Instructions (Signed)
Your Plan:  Continue Keppra 250 mg in AM and 500 mg in the PM If your symptoms worsen or you develop new symptoms please let us know.   Thank you for coming to see [place] at [facility]. I hope we have been able to provide you high quality care today.  You may receive a patient satisfaction survey over the next few weeks. We would appreciate your feedback and comments so that we may continue to improve ourselves and the health of our patients.
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
## 2022-01-06 IMAGING — DX DG RIBS W/ CHEST 3+V*R*
5 series · 5 of 5 positions shown · non-contrast
Comparison: None.

CLINICAL DATA: Persistent right posterolateral rib pain

EXAM:
RIGHT RIBS AND CHEST - 3+ VIEW

[chest pa]
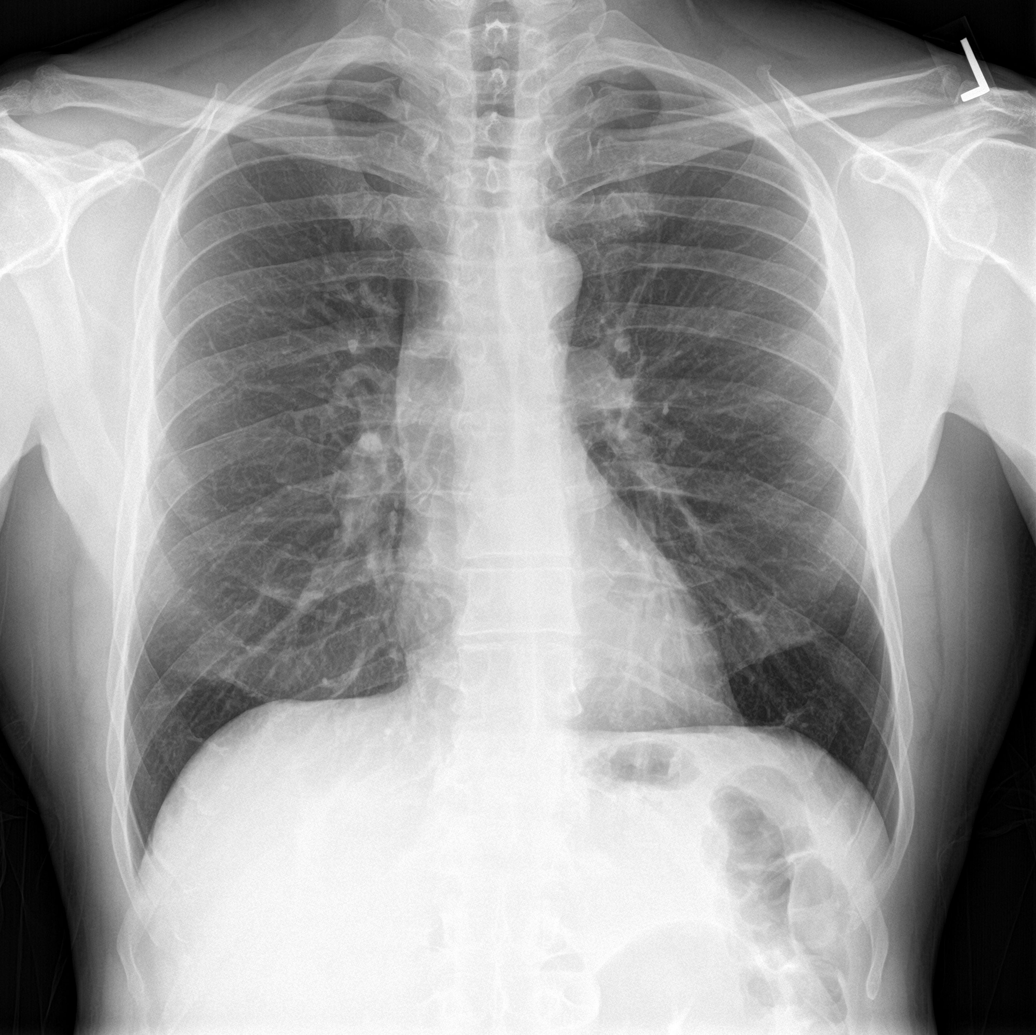

[rib ap (1 of 2)]
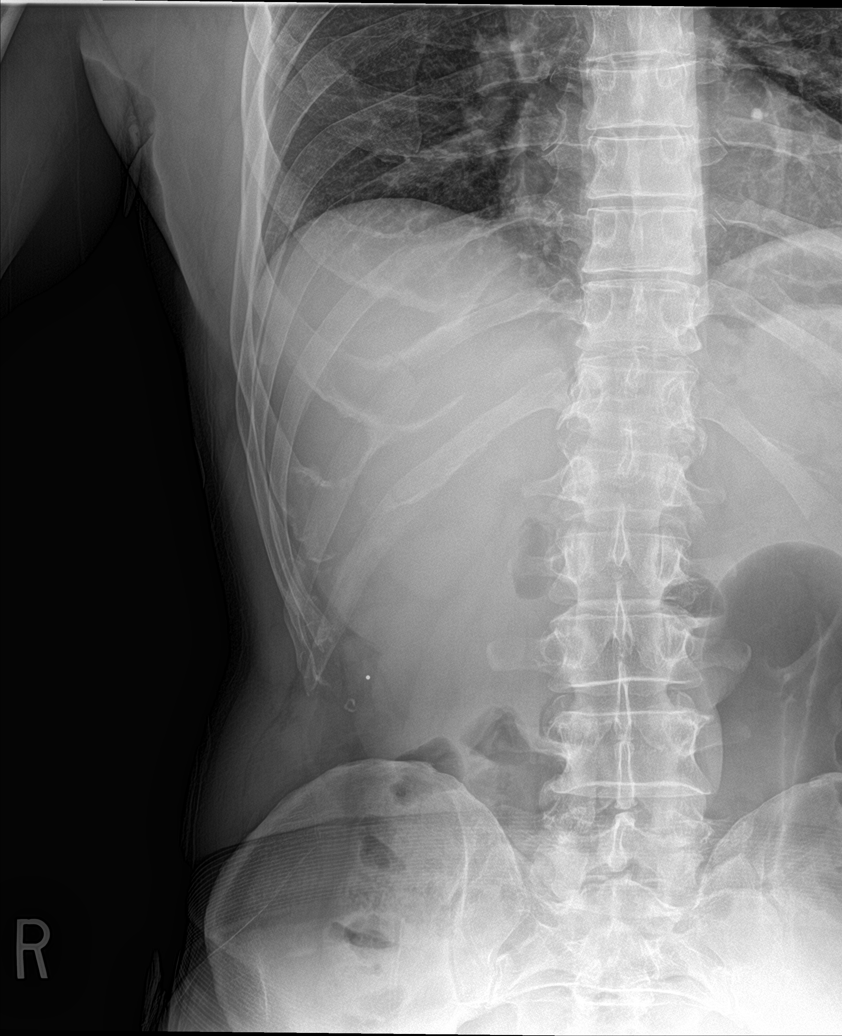

[rib ap (2 of 2)]
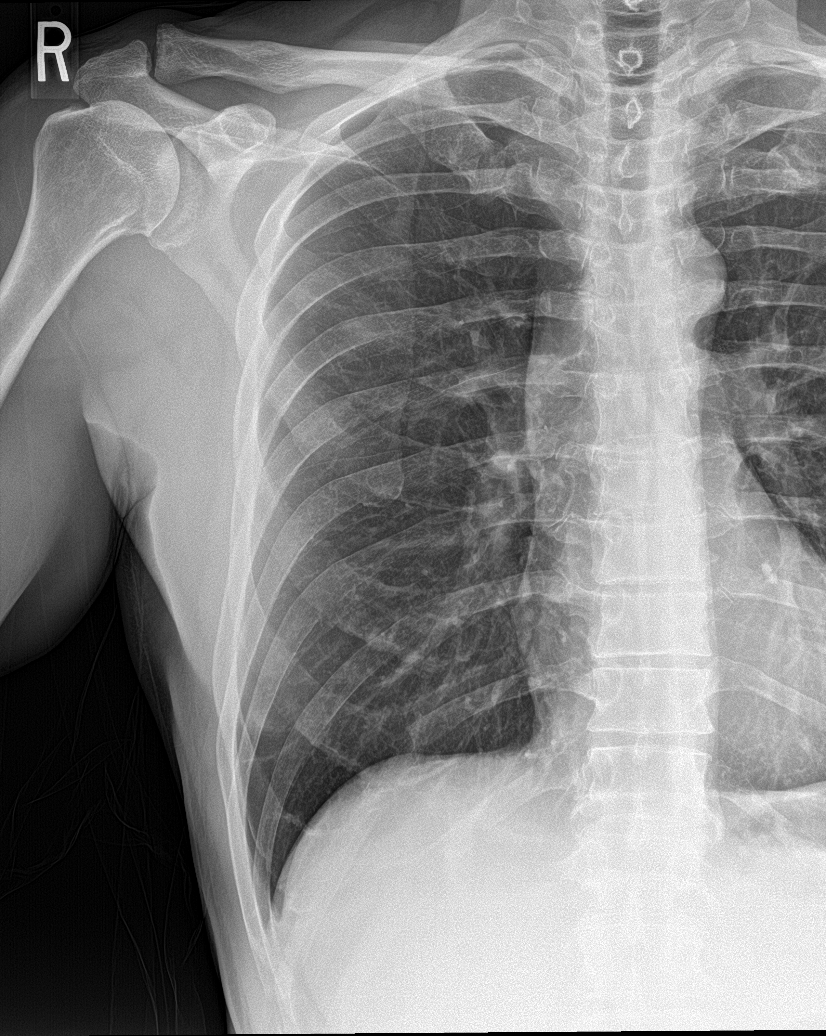

[rib ap obl (1 of 2)]
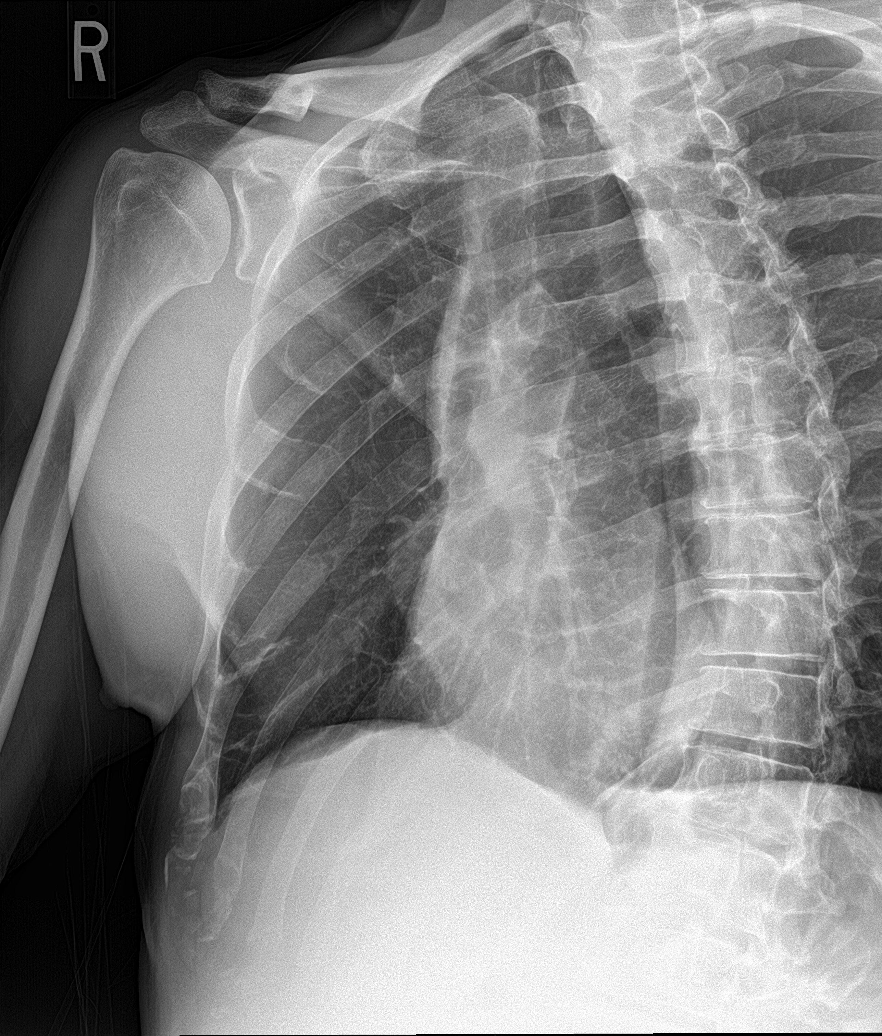

[rib ap obl (2 of 2)]
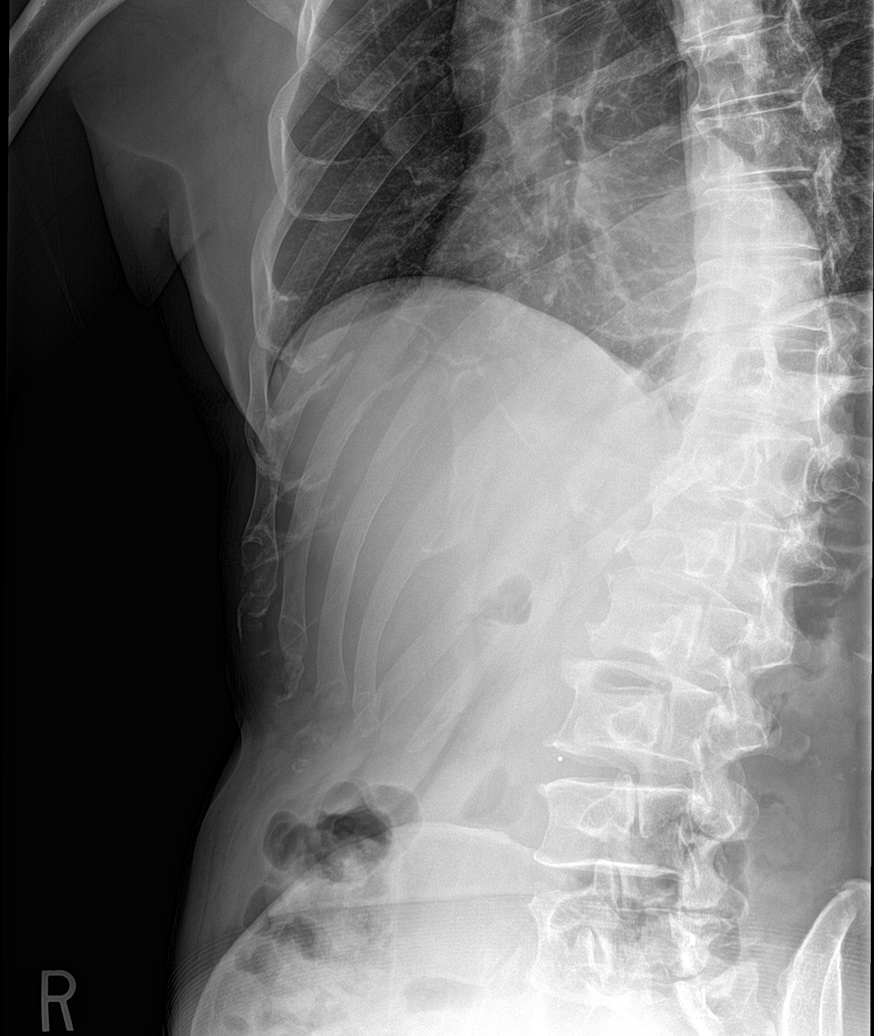

[5 of 5 positions shown; findings below may reference images not displayed]

FINDINGS: There is no pneumothorax. The heart size is normal. There is no
focal infiltrate. There are acute mildly displaced fractures
involving the posterior eleventh and twelfth ribs on the right.
There is questionable mild height loss of the T12 and L1 vertebral
bodies.
IMPRESSION: 1. Acute mildly displaced fractures involving the posterior eleventh
and twelfth ribs on the right.
2. Questionable mild height loss of the T12 and L1 vertebral bodies.
Dedicated thoracolumbar spine radiographs are recommended.

## 2022-10-18 ENCOUNTER — Ambulatory Visit (INDEPENDENT_AMBULATORY_CARE_PROVIDER_SITE_OTHER): Payer: Self-pay | Admitting: Adult Health

## 2022-10-18 ENCOUNTER — Encounter: Payer: Self-pay | Admitting: Adult Health

## 2022-10-18 VITALS — BP 146/83 | HR 82 | Ht 70.0 in | Wt 177.4 lb

## 2022-10-18 DIAGNOSIS — G40909 Epilepsy, unspecified, not intractable, without status epilepticus: Secondary | ICD-10-CM

## 2022-10-18 MED ORDER — LEVETIRACETAM 250 MG PO TABS: ORAL_TABLET | ORAL | 3 refills | Status: DC
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
## 2022-10-18 NOTE — Patient Instructions (Signed)
Your Plan:  Continue Keppra  If your symptoms worsen or you develop new symptoms please let us know.    Thank you for coming to see [place] at [facility]. I hope we have been able to provide you high quality care today.  You may receive a patient satisfaction survey over the next few weeks. We would appreciate your feedback and comments so that we may continue to improve ourselves and the health of our patients.

## 2023-06-12 ENCOUNTER — Ambulatory Visit (INDEPENDENT_AMBULATORY_CARE_PROVIDER_SITE_OTHER): Payer: 59 | Admitting: Family Medicine

## 2023-06-12 ENCOUNTER — Encounter: Payer: Self-pay | Admitting: Family Medicine

## 2023-06-12 VITALS — BP 120/80 | HR 77 | Temp 97.7°F | Ht 70.87 in | Wt 173.1 lb

## 2023-06-12 DIAGNOSIS — Z0000 Encounter for general adult medical examination without abnormal findings: Secondary | ICD-10-CM

## 2023-06-12 LAB — BASIC METABOLIC PANEL
BUN: 9 mg/dL (ref 6–23)
CO2: 30 mEq/L (ref 19–32)
Calcium: 9.9 mg/dL (ref 8.4–10.5)
Chloride: 100 mEq/L (ref 96–112)
Creatinine, Ser: 0.9 mg/dL (ref 0.40–1.50)
GFR: 92.9 mL/min (ref >60.00)
Glucose, Bld: 85 mg/dL (ref 70–99)
Potassium: 4.9 mEq/L (ref 3.5–5.1)
Sodium: 138 mEq/L (ref 135–145)

## 2023-06-12 LAB — CBC WITH DIFFERENTIAL/PLATELET
Basophils Absolute: 0 10*3/uL (ref 0.0–0.1)
Basophils Relative: 0.7 % (ref 0.0–3.0)
Eosinophils Absolute: 0 10*3/uL (ref 0.0–0.7)
Eosinophils Relative: 0.5 % (ref 0.0–5.0)
HCT: 40.6 % (ref 39.0–52.0)
Hemoglobin: 13.9 g/dL (ref 13.0–17.0)
Lymphocytes Relative: 31.5 % (ref 12.0–46.0)
Lymphs Abs: 1.6 10*3/uL (ref 0.7–4.0)
MCHC: 34.2 g/dL (ref 30.0–36.0)
MCV: 97.5 fl (ref 78.0–100.0)
Monocytes Absolute: 0.5 10*3/uL (ref 0.1–1.0)
Monocytes Relative: 8.9 % (ref 3.0–12.0)
Neutro Abs: 3 10*3/uL (ref 1.4–7.7)
Neutrophils Relative %: 58.4 % (ref 43.0–77.0)
Platelets: 297 10*3/uL (ref 150.0–400.0)
RBC: 4.17 Mil/uL — ABNORMAL LOW (ref 4.22–5.81)
RDW: 12.6 % (ref 11.5–15.5)
WBC: 5.1 10*3/uL (ref 4.0–10.5)

## 2023-06-12 LAB — HEPATIC FUNCTION PANEL
ALT: 8 U/L (ref 0–53)
AST: 14 U/L (ref 0–37)
Albumin: 4.4 g/dL (ref 3.5–5.2)
Alkaline Phosphatase: 48 U/L (ref 39–117)
Bilirubin, Direct: 0.1 mg/dL (ref 0.0–0.3)
Total Bilirubin: 0.6 mg/dL (ref 0.2–1.2)
Total Protein: 6.5 g/dL (ref 6.0–8.3)

## 2023-06-12 LAB — LIPID PANEL
Cholesterol: 212 mg/dL — ABNORMAL HIGH (ref 0–200)
HDL: 48.7 mg/dL (ref >39.00)
LDL Cholesterol: 141 mg/dL — ABNORMAL HIGH (ref 0–99)
NonHDL: 162.8
Total CHOL/HDL Ratio: 4
Triglycerides: 111 mg/dL (ref 0.0–149.0)
VLDL: 22.2 mg/dL (ref 0.0–40.0)

## 2023-06-12 LAB — PSA: PSA: 0.6 ng/mL (ref 0.10–4.00)

## 2023-06-12 LAB — TSH: TSH: 3.05 u[IU]/mL (ref 0.35–5.50)

## 2023-06-12 NOTE — Progress Notes (Signed)
Established Patient Office Visit  Subjective   Patient ID: Robert Nielsen, male    DOB: March 17, 1961  Age: 62 y.o. MRN: [id number]  Chief Complaint  Patient presents with   Annual Exam    HPI   Robert Nielsen is here for physical exam.  He has history of seizure disorder and is followed by neurology.  Maintained on Keppra.  No recent seizure.  Otherwise fairly healthy.  He has given up alcohol in the past couple of weeks and hopes to maintain abstinence.  He has strong family history of alcohol abuse.  Currently not exercising much but plans to pick this up soon  Health maintenance reviewed  -Last colonoscopy 2017 with tubular adenoma with recommended 5-year follow-up but patient declines follow-up at this time. -Declines shingles vaccine -Tetanus due in couple years  Family history-Father died in his early 46s of melanoma complications.  His mother died age 84 alcoholic cirrhosis.  He had sister that died around age 41 complications of hydrocephalus.  She was born with that.  He had a brother that died of reported primary liver cancer.  He was felt to be possibly occupation related.   Social history-he is married and has couple children.  He works for [employer].  Never smoked.  Has been drinking sometimes 2-4 alcoholic beverages per day mostly with bourbon, vodka, or whiskey but totally absent now for 2 weeks.  Also seeing counselor.  Past Medical History:  Diagnosis Date   Abdominal pain, left lower quadrant 04/19/2009   Qualifier: Diagnosis of  By: Caryl Never MD, Massiah Longanecker     Seizure [facility])    last seizure over  10 years ago   Seizure disorder ([facility]) 02/12/2013   Syncope and collapse    Past Surgical History:  Procedure Laterality Date   BLEPHAROPLASTY  2003   [place]   HERNIA REPAIR  1987, 1994   right side 2 times   LASIK  2000   MENISCUS REPAIR  11/2017   Right knee   TONSILLECTOMY      reports that he has never smoked. He has never used smokeless tobacco. He  reports current alcohol use. He reports that he does not use drugs. family history includes Alcohol abuse in his mother; Cancer in his brother; Cancer (age of onset: 40) in his father; Hypertension in his father. No Known Allergies  Review of Systems  Constitutional:  Negative for chills, fever, malaise/fatigue and weight loss.  HENT:  Negative for hearing loss.   Eyes:  Negative for blurred vision and double vision.  Respiratory:  Negative for cough and shortness of breath.   Cardiovascular:  Negative for chest pain, palpitations and leg swelling.  Gastrointestinal:  Negative for abdominal pain, blood in stool, constipation and diarrhea.  Genitourinary:  Negative for dysuria.  Skin:  Negative for rash.  Neurological:  Negative for dizziness, speech change, seizures, loss of consciousness and headaches.  Psychiatric/Behavioral:  Negative for depression.       Objective:     BP 120/80 (BP Location: Left Arm, Patient Position: Sitting, Cuff Size: Normal)   Pulse 77   Temp 97.7 F (36.5 C) (Oral)   Ht 5' 10.87" (1.8 m)   Wt 173 lb 1.6 oz (78.5 kg)   SpO2 99%   BMI 24.23 kg/m  BP Readings from Last 3 Encounters:  06/12/23 120/80  10/18/22 (!) 146/83  10/12/21 (!) 147/69   Wt Readings from Last 3 Encounters:  06/12/23 173 lb 1.6 oz (78.5 kg)  10/18/22 177 lb 6.4 oz (80.5 kg)  10/12/21 176 lb (79.8 kg)      Physical Exam Vitals reviewed.  Constitutional:      General: He is not in acute distress.    Appearance: He is well-developed. He is not ill-appearing.  HENT:     Head: Normocephalic and atraumatic.     Right Ear: External ear normal.     Left Ear: External ear normal.  Eyes:     Conjunctiva/sclera: Conjunctivae normal.     Pupils: Pupils are equal, round, and reactive to light.  Neck:     Thyroid: No thyromegaly.  Cardiovascular:     Rate and Rhythm: Normal rate and regular rhythm.     Heart sounds: Normal heart sounds. No murmur heard. Pulmonary:      Effort: No respiratory distress.     Breath sounds: No wheezing or rales.  Abdominal:     General: Bowel sounds are normal. There is no distension.     Palpations: Abdomen is soft. There is no mass.     Tenderness: There is no abdominal tenderness. There is no guarding or rebound.  Musculoskeletal:     Cervical back: Normal range of motion and neck supple.     Right lower leg: No edema.     Left lower leg: No edema.  Lymphadenopathy:     Cervical: No cervical adenopathy.  Skin:    Findings: No rash.  Neurological:     Mental Status: He is alert and oriented to person, place, and time.     Cranial Nerves: No cranial nerve deficit.      No results found for any visits on 06/12/23.    The 10-year ASCVD risk score (Arnett DK, et al., 2019) is: 6.3%    Assessment & Plan:   Problem List Items Addressed This Visit   None Visit Diagnoses     Physical exam    -  Primary   Relevant Orders   Basic metabolic panel   Lipid panel   CBC with Differential/Platelet   TSH   Hepatic function panel   PSA     Generally healthy 62 year old male.  He has seizure disorder followed by neurology and controlled with Keppra.  Past history of alcohol use disorder currently abstinent.  We discussed the following health maintenance items  -Overdue for colonoscopy.  We strongly advised that he follow-up for repeat colonoscopy but he declines.  He has very high deductible currently.  We reviewed the fact that he had tubular adenoma previously and needs to get this if possible within the next year -Obtain screening labs as above including PSA -Try to establish more consistent exercise habits -Continue good sun protection with his positive family history of melanoma.  No follow-ups on file.    Evelena Peat, MD

## 2023-06-12 NOTE — Patient Instructions (Signed)
Consider repeat colonoscopy at some point this year.

## 2023-10-17 ENCOUNTER — Telehealth (INDEPENDENT_AMBULATORY_CARE_PROVIDER_SITE_OTHER): Payer: 59 | Admitting: Adult Health

## 2023-10-17 DIAGNOSIS — G40909 Epilepsy, unspecified, not intractable, without status epilepticus: Secondary | ICD-10-CM | POA: Diagnosis not present

## 2023-10-17 MED ORDER — LEVETIRACETAM 250 MG PO TABS: ORAL_TABLET | ORAL | 3 refills | Status: DC
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
## 2024-10-07 ENCOUNTER — Other Ambulatory Visit: Payer: Self-pay | Admitting: Adult Health
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
## 2024-10-21 ENCOUNTER — Telehealth: Payer: 59 | Admitting: Adult Health

## 2024-10-21 DIAGNOSIS — G40909 Epilepsy, unspecified, not intractable, without status epilepticus: Secondary | ICD-10-CM | POA: Diagnosis not present

## 2024-10-21 MED ORDER — LEVETIRACETAM 250 MG PO TABS: ORAL_TABLET | ORAL | 4 refills | Status: AC

## 2024-10-21 NOTE — Patient Instructions (Signed)
Your Plan:  Continue Keppra  If your symptoms worsen or you develop new symptoms please let us know.    Thank you for coming to see us at [facility]. I hope we have been able to provide you high quality care today.  You may receive a patient satisfaction survey over the next few weeks. We would appreciate your feedback and comments so that we may continue to improve ourselves and the health of our patients.  

## 2024-10-22 ENCOUNTER — Telehealth: Payer: Self-pay | Admitting: Adult Health

## 2024-10-22 NOTE — Telephone Encounter (Signed)
 Pt called stating that he missed a call from Our offcie stating that his insurance was needed . Pt stated tha he is self pay . He as no insurance

## 2025-10-20 ENCOUNTER — Telehealth: Payer: Self-pay | Admitting: Adult Health
